# Patient Record
Sex: Female | Born: 1960 | Race: White | Hispanic: No | Marital: Married | Smoking: Never smoker
Health system: Southern US, Community
[De-identification: ages and names within clinical notes are randomized; demographics above are authoritative.]

## PROBLEM LIST (undated history)

## (undated) DIAGNOSIS — T7840XA Allergy, unspecified, initial encounter: Secondary | ICD-10-CM

## (undated) DIAGNOSIS — H8109 Meniere's disease, unspecified ear: Secondary | ICD-10-CM

## (undated) DIAGNOSIS — M8580 Other specified disorders of bone density and structure, unspecified site: Secondary | ICD-10-CM

## (undated) DIAGNOSIS — F419 Anxiety disorder, unspecified: Secondary | ICD-10-CM

## (undated) HISTORY — DX: Other specified disorders of bone density and structure, unspecified site: M85.80

## (undated) HISTORY — DX: Allergy, unspecified, initial encounter: T78.40XA

## (undated) HISTORY — PX: KNEE SURGERY: SHX244

## (undated) HISTORY — DX: Anxiety disorder, unspecified: F41.9

## (undated) HISTORY — PX: TONSILLECTOMY: SUR1361

## (undated) HISTORY — DX: Meniere's disease, unspecified ear: H81.09

## (undated) HISTORY — PX: KNEE ARTHROSCOPY: SUR90

---

## 2001-01-30 ENCOUNTER — Other Ambulatory Visit: Admission: RE | Admit: 2001-01-30 | Discharge: 2001-01-30 | Payer: Self-pay | Admitting: Obstetrics and Gynecology

## 2003-02-11 ENCOUNTER — Other Ambulatory Visit: Admission: RE | Admit: 2003-02-11 | Discharge: 2003-02-11 | Payer: Self-pay | Admitting: Obstetrics and Gynecology

## 2003-03-02 ENCOUNTER — Ambulatory Visit (HOSPITAL_COMMUNITY): Admission: RE | Admit: 2003-03-02 | Discharge: 2003-03-02 | Payer: Self-pay | Admitting: Obstetrics and Gynecology

## 2003-07-26 ENCOUNTER — Emergency Department (HOSPITAL_COMMUNITY): Admission: EM | Admit: 2003-07-26 | Discharge: 2003-07-26 | Payer: Self-pay | Admitting: Emergency Medicine

## 2006-02-22 ENCOUNTER — Other Ambulatory Visit: Admission: RE | Admit: 2006-02-22 | Discharge: 2006-02-22 | Payer: Self-pay | Admitting: Obstetrics & Gynecology

## 2009-10-13 ENCOUNTER — Ambulatory Visit (HOSPITAL_COMMUNITY): Admission: RE | Admit: 2009-10-13 | Discharge: 2009-10-13 | Payer: Self-pay | Admitting: Obstetrics & Gynecology

## 2009-10-20 ENCOUNTER — Ambulatory Visit (HOSPITAL_COMMUNITY): Admission: RE | Admit: 2009-10-20 | Discharge: 2009-10-20 | Payer: Self-pay | Admitting: Obstetrics & Gynecology

## 2011-04-06 ENCOUNTER — Emergency Department
Admission: EM | Admit: 2011-04-06 | Discharge: 2011-04-06 | Disposition: A | Discharge status: Home / Self Care | Payer: 59 | Source: Home / Self Care | Attending: Emergency Medicine | Admitting: Emergency Medicine

## 2011-04-06 ENCOUNTER — Encounter: Payer: Self-pay | Admitting: *Deleted

## 2011-04-06 DIAGNOSIS — R300 Dysuria: Secondary | ICD-10-CM

## 2011-04-06 DIAGNOSIS — N390 Urinary tract infection, site not specified: Secondary | ICD-10-CM

## 2011-04-06 LAB — POCT URINALYSIS DIP (MANUAL ENTRY)
Spec Grav, UA: 1.01 (ref 1.005–1.03)
Urobilinogen, UA: 0.2 (ref 0–1)
pH, UA: 7 (ref 5–8)

## 2011-04-06 MED ORDER — CIPROFLOXACIN HCL 500 MG PO TABS: 500.0000 mg | ORAL_TABLET | Freq: Two times a day (BID) | ORAL | Status: AC

## 2011-04-06 NOTE — ED Notes (Signed)
Patient reports dysuria, frequency and pressure x last night. Taken AZO OTC.

## 2011-04-06 NOTE — ED Provider Notes (Signed)
History     CSN: [id number]  Arrival date & time 04/06/11  5621   First MD Initiated Contact with Patient 04/06/11 1819      No chief complaint on file.   (Consider location/radiation/quality/duration/timing/severity/associated sxs/prior treatment) HPI Jody Clarke is a 50 y.o. female who presents today with UTI symptoms for 2 days.  + dysuria + frequency + urgency No hematuria No vaginal discharge No fever/chills No lower abdominal pain No back pain No fatigue    No past medical history on file.  No past surgical history on file.  No family history on file.  History  Substance Use Topics  . Smoking status: Not on file  . Smokeless tobacco: Not on file  . Alcohol Use: Not on file    OB History    No data available      Review of Systems  Allergies  Review of patient's allergies indicates not on file.  Home Medications  No current outpatient prescriptions on file.  There were no vitals taken for this visit.  Physical Exam  Nursing note and vitals reviewed. Constitutional: She is oriented to person, place, and time. She appears well-developed and well-nourished.  HENT:  Head: Normocephalic and atraumatic.  Eyes: No scleral icterus.  Neck: Neck supple.  Cardiovascular: Regular rhythm and normal heart sounds.   Pulmonary/Chest: Effort normal and breath sounds normal. No respiratory distress.  Abdominal: Soft. Normal appearance and bowel sounds are normal. She exhibits no mass. There is no rigidity, no rebound, no guarding and no CVA tenderness.  Neurological: She is alert and oriented to person, place, and time.  Skin: Skin is warm and dry.  Psychiatric: She has a normal mood and affect. Her speech is normal.    ED Course  Procedures (including critical care time)   Labs Reviewed  URINE CULTURE  POCT URINALYSIS DIP (MANUAL ENTRY)   No results found.   1. Dysuria   2. Urinary tract infection, site not specified       MDM  1) Take the  prescribed antibiotic as directed. 2) A urinalysis was done in clinic.  A urine culture is pending. 3) Follow up with your PCP or urologist if not improving or if worsening symptoms.  Patient states that she remembers perhaps taking Cipro in the past and thinks it may have made her a little bit agitated, but she is very unsure if past with it was. I told her to she has any side effects, that she should call us and we will change his antibiotic to either Bactrim or Macrobid, depending on the sensitivities.    Lily Kocher, MD 04/06/11 [name]

## 2011-04-07 ENCOUNTER — Telehealth: Payer: Self-pay | Admitting: Emergency Medicine

## 2011-04-09 LAB — URINE CULTURE: Colony Count: 40000

## 2011-11-10 ENCOUNTER — Emergency Department
Admission: EM | Admit: 2011-11-10 | Discharge: 2011-11-10 | Disposition: A | Discharge status: Home / Self Care | Payer: 59 | Source: Home / Self Care

## 2011-11-10 ENCOUNTER — Encounter: Payer: Self-pay | Admitting: *Deleted

## 2011-11-10 DIAGNOSIS — J329 Chronic sinusitis, unspecified: Secondary | ICD-10-CM

## 2011-11-10 MED ORDER — AMOXICILLIN 875 MG PO TABS: 875.0000 mg | ORAL_TABLET | Freq: Two times a day (BID) | ORAL | Status: AC

## 2011-11-10 MED ORDER — METHYLPREDNISOLONE ACETATE 80 MG/ML IJ SUSP: 80.0000 mg | Freq: Once | INTRAMUSCULAR | Status: DC

## 2011-11-10 MED ORDER — FLUTICASONE PROPIONATE 50 MCG/ACT NA SUSP: 2.0000 | Freq: Every day | NASAL | Status: DC

## 2011-11-10 MED ORDER — CETIRIZINE HCL 10 MG PO TABS: 10.0000 mg | ORAL_TABLET | Freq: Every day | ORAL | Status: DC

## 2011-11-10 NOTE — ED Provider Notes (Signed)
History     CSN: [id number]  Arrival date & time 11/10/11  1516   First MD Initiated Contact with Patient 11/10/11 1522      Chief Complaint  Patient presents with  . Sinus Problem   HPI SINUSITIS Onset:  4-5 days  Location: frontal sinusus Description:sinus pressure and congestion, mild headache  Modifying factors: Pt recently treated for sinusitis by PCP with amox x 10 days. Sxs initially improved, then returned after 2-3 days off of antibiotics.  Baseline hx/o meniere's disease. Has had mild dizziness since recurrence of sinus pressure. No fevers, chills.    Symptoms Cough:  no Discharge:  no Fever: no Sinus Pressure:  yes Ears Blocked:  mild Teeth Ache:  no Frontal Headache:  yes Second Sickening:  No Itchy eyes/scratchty throat: yes   Red Flags Change in mental state: no Change in vision: mild intermittent blurred vision.     History reviewed. No pertinent past medical history.  Past Surgical History  Procedure Date  . Knee arthroscopy     right    History reviewed. No pertinent family history.  History  Substance Use Topics  . Smoking status: Never Smoker   . Smokeless tobacco: Not on file  . Alcohol Use: Yes    OB History    Grav Para Term Preterm Abortions TAB SAB Ect Mult Living                  Review of Systems  All other systems reviewed and are negative.    Allergies  Ciprofloxacin  Home Medications   Current Outpatient Rx  Name Route Sig Dispense Refill  . AMOXICILLIN 875 MG PO TABS Oral Take 1 tablet (875 mg total) by mouth 2 (two) times daily. 14 tablet 0  . CETIRIZINE HCL 10 MG PO TABS Oral Take 1 tablet (10 mg total) by mouth daily. 30 tablet 6  . FLUTICASONE PROPIONATE 50 MCG/ACT NA SUSP Nasal Place 2 sprays into the nose daily. 16 g 12    BP 104/72  Pulse 71  Temp 97.6 F (36.4 C) (Oral)  Resp 14  Ht 5\' 3"  (1.6 m)  Wt 113 lb 4 oz (51.37 kg)  BMI 20.06 kg/m2  SpO2 100%  Physical Exam  Constitutional: She  appears well-developed and well-nourished.  HENT:  Head: Normocephalic and atraumatic.  Right Ear: External ear normal.  Left Ear: External ear normal.       +mild nasal erythema, rhinorrhea bilaterally, + post oropharyngeal erythema  + mild frontal sinus TTP   Eyes: Conjunctivae are normal. Pupils are equal, round, and reactive to light.  Neck: Normal range of motion. Neck supple.  Cardiovascular: Normal rate and regular rhythm.   Pulmonary/Chest: Effort normal and breath sounds normal.  Abdominal: Soft. Bowel sounds are normal.  Musculoskeletal: Normal range of motion.  Lymphadenopathy:    She has no cervical adenopathy.  Neurological: She is alert.  Skin: Skin is warm.    ED Course  Procedures (including critical care time)  Labs Reviewed - No data to display No results found.   1. Sinusitis       MDM  Depo medrol 80mg  IM x1. This should help with sinus inflammation and mild menieres flare.  Will restart amox.  Plan for ENT follow up as pt may benefit from sinus CT.  Flonase and zyrtec for allergic rhinitis component.  Handout given.  Infectious red flags reviewed.  Follow up as needed.      The patient and/or caregiver  has been counseled thoroughly with regard to treatment plan and/or medications prescribed including dosage, schedule, interactions, rationale for use, and possible side effects and they verbalize understanding. Diagnoses and expected course of recovery discussed and will return if not improved as expected or if the condition worsens. Patient and/or caregiver verbalized understanding.             Floydene Flock, MD 11/10/11 [phone]

## 2011-11-10 NOTE — ED Notes (Signed)
Pt treated a wk ago for sinusitis with amoxicillin.  Sx came back 4 days ago sinus pain and pressure, fatigue and dizziness

## 2011-11-15 NOTE — ED Provider Notes (Signed)
Agree with exam, assessment, and plan.   Lattie Haw, MD 11/15/11 [phone]

## 2012-04-16 HISTORY — PX: OTHER SURGICAL HISTORY: SHX169

## 2012-09-01 ENCOUNTER — Telehealth: Payer: Self-pay | Admitting: Obstetrics & Gynecology

## 2012-09-01 NOTE — Telephone Encounter (Signed)
See note below. Please advise. sue 

## 2012-09-01 NOTE — Telephone Encounter (Signed)
Patient saw her PCP two weeks ago for what she thought was a UTI. She was prescribed an antibiotic; the culture came back normal/no growth. She returned to her PCP this last week because she symptoms have not gone away. She was put on a second antibiotic and another culture was taken. The second culture came back normal/ no growth. A pelvic exam was completed by her PCP and everything was normal. Her PCP recommended that she follow up with a Urologist since the symptoms still have not subsided. The main symptoms are pressure in her bladder, and frequency of urination. The Patient would like to know if she should follow up with [place] before the Urologist?

## 2012-09-02 NOTE — Telephone Encounter (Signed)
Not sure if I will find anything but I'm glad to see her before she sees a urologist.  We can always refer her to urology as well.  Please make OV.  See if she can come at 10:30 today or 3:00pm.  Both work with my schedule.

## 2012-09-02 NOTE — Telephone Encounter (Signed)
Patient notified of Dr. Hyacinth Meeker response. Patient is currently out of town this week . Patient states based on Dr. Hyacinth Meeker response and her PCP stating normal pelvic exam and normal /no growth culture of urine will plan on doing referral to urologist form PCP office. Jody Clarke

## 2012-09-13 ENCOUNTER — Emergency Department: Payer: Self-pay | Admitting: Emergency Medicine

## 2012-09-13 LAB — COMPREHENSIVE METABOLIC PANEL
Albumin: 4 g/dL (ref 3.4–5.0)
Alkaline Phosphatase: 61 U/L (ref 50–136)
Anion Gap: 8 (ref 7–16)
Bilirubin,Total: 0.3 mg/dL (ref 0.2–1.0)
Calcium, Total: 9 mg/dL (ref 8.5–10.1)
Co2: 26 mmol/L (ref 21–32)
EGFR (African American): >60
EGFR (Non-African Amer.): >60
Glucose: 172 mg/dL — ABNORMAL HIGH (ref 65–99)
Osmolality: 282 (ref 275–301)
Potassium: 3.3 mmol/L — ABNORMAL LOW (ref 3.5–5.1)
SGOT(AST): 24 U/L (ref 15–37)
Sodium: 138 mmol/L (ref 136–145)
Total Protein: 7.3 g/dL (ref 6.4–8.2)

## 2012-09-13 LAB — CK TOTAL AND CKMB (NOT AT ARMC)
CK, Total: 53 U/L (ref 21–215)
CK-MB: 0.6 ng/mL (ref 0.5–3.6)

## 2012-09-13 LAB — CBC
Platelet: 252 10*3/uL (ref 150–440)
RBC: 4.37 10*6/uL (ref 3.80–5.20)
RDW: 12.6 % (ref 11.5–14.5)
WBC: 8.4 10*3/uL (ref 3.6–11.0)

## 2012-09-13 LAB — TROPONIN I: Troponin-I: <0.02 ng/mL

## 2012-11-14 HISTORY — PX: ENDOLYMPHATIC SHUNT DECOMPRESSION: SHX1498

## 2012-12-03 DIAGNOSIS — H81319 Aural vertigo, unspecified ear: Secondary | ICD-10-CM | POA: Insufficient documentation

## 2013-02-02 ENCOUNTER — Telehealth: Payer: Self-pay | Admitting: Obstetrics & Gynecology

## 2013-02-02 NOTE — Telephone Encounter (Signed)
[facility].  [facility] internal medicine (dr. Lynne Logan office)  Either.

## 2013-02-02 NOTE — Telephone Encounter (Signed)
Patient is wanting a recommendation from [facility] for a pcp. More of the internist route not a family doctor

## 2013-02-02 NOTE — Telephone Encounter (Signed)
Routing to Dr. Miller

## 2013-02-03 NOTE — Telephone Encounter (Signed)
LM on pt's VM confirming name re: internists Dr. Hyacinth Meeker recommends. [facility] [phone], or Dr. Juline Patch [phone]. Pt to call back with any questions.

## 2013-05-19 ENCOUNTER — Encounter: Payer: Self-pay | Admitting: Obstetrics & Gynecology

## 2013-05-21 ENCOUNTER — Ambulatory Visit: Payer: Self-pay | Admitting: Obstetrics & Gynecology

## 2013-06-08 ENCOUNTER — Encounter: Payer: Self-pay | Admitting: Obstetrics & Gynecology

## 2013-06-08 ENCOUNTER — Ambulatory Visit (INDEPENDENT_AMBULATORY_CARE_PROVIDER_SITE_OTHER): Payer: 59 | Admitting: Obstetrics & Gynecology

## 2013-06-08 VITALS — BP 110/68 | HR 70 | Resp 16 | Ht 62.75 in | Wt 103.8 lb

## 2013-06-08 DIAGNOSIS — Z01419 Encounter for gynecological examination (general) (routine) without abnormal findings: Secondary | ICD-10-CM

## 2013-06-08 DIAGNOSIS — Z0000 Encounter for general adult medical examination without abnormal findings: Secondary | ICD-10-CM

## 2013-06-08 DIAGNOSIS — Z1239 Encounter for other screening for malignant neoplasm of breast: Secondary | ICD-10-CM

## 2013-06-08 LAB — POCT URINALYSIS DIPSTICK
Bilirubin, UA: NEGATIVE
Blood, UA: NEGATIVE
Glucose, UA: NEGATIVE
Ketones, UA: NEGATIVE
Leukocytes, UA: NEGATIVE
Nitrite, UA: NEGATIVE
Protein, UA: NEGATIVE
Urobilinogen, UA: NEGATIVE
pH, UA: 5

## 2013-06-08 MED ORDER — ALPRAZOLAM 0.5 MG PO TABS: 0.5000 mg | ORAL_TABLET | Freq: Every evening | ORAL | Status: DC | PRN

## 2013-06-08 NOTE — Patient Instructions (Signed)

## 2013-06-08 NOTE — Progress Notes (Signed)
53 y.o. G2P1 MarriedCaucasianF here for annual exam.  Has had really severe issues with Meniere's disease.  Has endolymphatic shunt.  [facility] like he has done all he can.  Dr. Wylene Simmerisovec has put her on Zoloft.    Isn't exercising at all right now because she feels the dizziness.  Shopped yesterday with her daughter and after three or four stores, she got the same dizziness.   Has lost 9 pounds but she feels this is due to not feeling like eating when she is dizzy.    Saw a urologist last year due to sensation of having a UTI.  Cystoscopy was negative.  These symptoms have completely resolved.    Daughter getting married in May.  Is a patient here.    Patient's last menstrual period was 04/16/2008.          Sexually active: yes  The current method of family planning is none and post menopausal status.    Exercising: no  not recently due to health issues Smoker:  no  Health Maintenance: Pap:  02/19/12 WNL/negative HR HPV History of abnormal Pap:  no MMG:  10/13/09 normal Colonoscopy:  none BMD:   10/20/09-osteopenia TDaP:  Up to date Screening Labs: Dr. Wylene Simmerisovec, Hb today: PCP, Urine today: negative   reports that she has never smoked. She has never used smokeless tobacco. She reports that she drinks alcohol. She reports that she does not use illicit drugs.  Past Medical History  Diagnosis Date  . Anxiety   . Meniere's disease   . Osteopenia     Past Surgical History  Procedure Laterality Date  . Knee arthroscopy      right  . [facility]    Current Outpatient Prescriptions  Medication Sig Dispense Refill  . ALPRAZOLAM PO Take by mouth as needed.      . Calcium Carb-Cholecalciferol (CALCIUM + D3) 600-200 MG-UNIT TABS Take 1 tablet by mouth daily.      Marland Kitchen. lactobacillus acidophilus (BACID) TABS tablet Take 2 tablets by mouth 3 (three) times daily.      . Multiple Vitamins-Minerals (MULTIVITAMIN PO) Take by mouth daily.      . RESTASIS 0.05 %  ophthalmic emulsion Place 1 drop into both eyes daily.      . sertraline (ZOLOFT) 50 MG tablet Take 50 mg by mouth daily.      Marland Kitchen. triamterene-hydrochlorothiazide (DYAZIDE) 37.5-25 MG per capsule Take 1 capsule by mouth daily.      . cetirizine (ZYRTEC) 10 MG tablet Take 1 tablet (10 mg total) by mouth daily.  30 tablet  6  . fluticasone (FLONASE) 50 MCG/ACT nasal spray Place 2 sprays into the nose daily.  16 g  12   No current facility-administered medications for this visit.    Family History  Problem Relation Age of Onset  . Atrial fibrillation Father   . Osteoporosis Mother     ROS:  Pertinent items are noted in HPI.  Otherwise, a comprehensive ROS was negative.  Exam:   BP 110/68  Pulse 70  Resp 16  Ht 5' 2.75" (1.594 m)  Wt 103 lb 12.8 oz (47.083 kg)  BMI 18.53 kg/m2  LMP 04/16/2008  Weight change: -9lbs  Height: 5' 2.75" (159.4 cm)  Ht Readings from Last 3 Encounters:  06/08/13 5' 2.75" (1.594 m)  11/10/11 5\' 3"  (1.6 m)  04/06/11 5' 2.5" (1.588 m)    General appearance: alert, cooperative  and appears stated age Head: Normocephalic, without obvious abnormality, atraumatic Neck: no adenopathy, supple, symmetrical, trachea midline and thyroid normal to inspection and palpation Lungs: clear to auscultation bilaterally Breasts: normal appearance, no masses or tenderness Heart: regular rate and rhythm Abdomen: soft, non-tender; bowel sounds normal; no masses,  no organomegaly Extremities: extremities normal, atraumatic, no cyanosis or edema Skin: Skin color, texture, turgor normal. No rashes or lesions Lymph nodes: Cervical, supraclavicular, and axillary nodes normal. No abnormal inguinal nodes palpated Neurologic: Grossly normal   Pelvic: External genitalia:  no lesions              Urethra:  normal appearing urethra with no masses, tenderness or lesions              Bartholins and Skenes: normal                 Vagina: normal appearing vagina with normal color and  discharge, no lesions              Cervix: no lesions              Pap taken: no Bimanual Exam:  Uterus:  normal size, contour, position, consistency, mobility, non-tender              Adnexa: normal adnexa and no mass, fullness, tenderness               Rectovaginal: Confirms               Anus:  normal sphincter tone, no lesions  A:  Well Woman with normal exam PMP, no HRT Meniere's disease Anxiety Osteopenia  P:   Mammogram HIGHLY recommended.  Pt knows I think she needs one.  Order placed. Colonoscopy recommended. Pt declined. D/W pt trial of HRT.  Risks/benefits discussed.   pap smear with neg HR HPV 11/13.  No Pap today. return annually or prn  An After Visit Summary was printed and given to the patient.

## 2013-06-09 LAB — HEMOGLOBIN, FINGERSTICK: Hemoglobin, fingerstick: 14.7 g/dL (ref 12.0–16.0)

## 2013-06-22 ENCOUNTER — Telehealth: Payer: Self-pay | Admitting: Obstetrics & Gynecology

## 2013-06-22 NOTE — Telephone Encounter (Signed)
Spoke with patient. She has been on Zoloft x 3 weeks. She is feeling "slightly improved" but still has "moments of agitation".  States "I feel up and down."  On Friday it will be 4 weeks that patient has been on treatment and wondering what Dr. Hyacinth MeekerMiller would suggest? Does she need to give it more time with Zoloft or can she start with HRT therapy?    I advised I would send a message to Dr. Hyacinth MeekerMiller or covering provider to review and advise. I also advised patient that Dr. Hyacinth MeekerMiller wanted her to schedule Mammogram as well. Patient states she will call today to schedule Mammogram.

## 2013-06-22 NOTE — Telephone Encounter (Signed)
She and I discussed starting this if the Zoloft didn't work.  She will need to be on both estrogen and progesterone.  She also KNOWS she needs a MMG and I cannot continue the HRT if she doesn't get a MMG.  OK to call in Estradiol 1.0mg  daily and Prometrium 200mg  days 1-15 each month.  Needs f/u 4 weeks.  No refills if she hasn't gotten a MMG by then.  Please be kind when telling her this as she has been struggling with vertigo but she and I did discuss this at her exam and she knows how important I think this is.  Thank you.

## 2013-06-22 NOTE — Telephone Encounter (Signed)
Patient calling to with some questions for nurse about HRT as she thinks she may be ready to start this therapy.  [facility]  [address] [place]

## 2013-06-23 ENCOUNTER — Ambulatory Visit (HOSPITAL_COMMUNITY)
Admission: RE | Admit: 2013-06-23 | Discharge: 2013-06-23 | Disposition: A | Discharge status: Home / Self Care | Payer: 59 | Source: Ambulatory Visit | Attending: Obstetrics & Gynecology | Admitting: Obstetrics & Gynecology

## 2013-06-23 DIAGNOSIS — Z1239 Encounter for other screening for malignant neoplasm of breast: Secondary | ICD-10-CM

## 2013-06-23 DIAGNOSIS — Z1231 Encounter for screening mammogram for malignant neoplasm of breast: Secondary | ICD-10-CM | POA: Insufficient documentation

## 2013-06-23 MED ORDER — PROGESTERONE MICRONIZED 200 MG PO CAPS: ORAL_CAPSULE | ORAL | Status: DC

## 2013-06-23 MED ORDER — ESTRADIOL 1 MG PO TABS: 1.0000 mg | ORAL_TABLET | Freq: Every day | ORAL | Status: DC

## 2013-06-23 NOTE — Telephone Encounter (Signed)
Can close encounter if pt agreeable.

## 2013-06-23 NOTE — Telephone Encounter (Signed)
Spoke with patient and message from Dr. Hyacinth MeekerMiller given.  Patient states that Dr. Hyacinth MeekerMiller suggested that she start on a low dose HRT and states she was told it would be 0.5 mg of "some medication".  I advised the order I placed was what was ordered by Dr. Hyacinth MeekerMiller, but that I would clarify. Dr. Hyacinth MeekerMiller do you want to continue with Estradiol at 1.0 mg daily?  Patient scheduled for 4 week follow up.  [facility]

## 2013-06-23 NOTE — Telephone Encounter (Signed)
She can start with 1/2 tablet.  She is correct that we discussed the 0.5mg  dose.

## 2013-06-24 NOTE — Telephone Encounter (Signed)
Spoke with patient. Agreeable to half a tablet to start with. Has follow up scheduled. Will wait until 07/15/13 to start progesterone.   Routing to provider for final review. Patient agreeable to disposition. Will close encounter

## 2013-07-21 ENCOUNTER — Ambulatory Visit (INDEPENDENT_AMBULATORY_CARE_PROVIDER_SITE_OTHER): Payer: 59 | Admitting: Obstetrics & Gynecology

## 2013-07-21 VITALS — BP 100/64 | HR 64 | Resp 16 | Ht 62.75 in | Wt 104.0 lb

## 2013-07-21 DIAGNOSIS — Z79890 Hormone replacement therapy (postmenopausal): Secondary | ICD-10-CM

## 2013-07-21 MED ORDER — ESTRADIOL 1 MG PO TABS: 1.0000 mg | ORAL_TABLET | Freq: Every day | ORAL | Status: DC

## 2013-07-21 MED ORDER — PROGESTERONE MICRONIZED 200 MG PO CAPS: ORAL_CAPSULE | ORAL | Status: DC

## 2013-07-21 NOTE — Progress Notes (Signed)
Subjective:     Patient ID: Jody Clarke, female   DOB: 02/06/1961, 53 y.o.   MRN: [id number]  HPI 53 yo G2P1 MWF here for follow up after starting HRT.  Dizziness is better.  Has been able to exercise some which really makes her happy.  Saw Dr. Wylene Simmerisovec and he increased her Zoloft to 100mg .  Feels like having some neck issues which she feels is related to surgery.  Ready to start physical therapy.  She is ready to start that once she gets through her daughter's wedding.  Doesn't want anything to rock the boat right now as she is finally starting to feel better.  Unsure, however, if improvement is zoloft or HRT.  Taking Estradiol 0.5mg  daily.  Started Prometrium 200mg  days 1-15 monthly (April is first month that she is on the prometrium).  Hasn't finished the Prometrium yet but doesn't feel any worse or better while taking it.  Advised to expect bleeding and to let me know if occurs.  Review of Systems  All other systems reviewed and are negative.      Objective:   Physical Exam  Constitutional: She is oriented to person, place, and time. She appears well-developed and well-nourished.  Neurological: She is alert and oriented to person, place, and time.  Skin: Skin is warm and dry.  Psychiatric: She has a normal mood and affect.       Assessment:     Dizziness that has improved New start to HRT    Plan:     Continue current HRT dosage.  Pt is not going to change this until after her daughter's wedding.  Is probably going to try to wean off Zoloft or the HRT to see what actually improved her symptoms.    For now, rx done for a year.  Pt will let me know when ready to make a change so I can guide her in weaning off HRT.  ~15 minutes spent with patient >50% of time was in face to face discussion of above.

## 2013-07-22 ENCOUNTER — Encounter: Payer: Self-pay | Admitting: Obstetrics & Gynecology

## 2014-02-15 ENCOUNTER — Encounter: Payer: Self-pay | Admitting: Obstetrics & Gynecology

## 2014-04-30 ENCOUNTER — Telehealth: Payer: Self-pay | Admitting: Obstetrics & Gynecology

## 2014-04-30 ENCOUNTER — Ambulatory Visit (INDEPENDENT_AMBULATORY_CARE_PROVIDER_SITE_OTHER): Payer: 59 | Admitting: Certified Nurse Midwife

## 2014-04-30 ENCOUNTER — Encounter: Payer: Self-pay | Admitting: Certified Nurse Midwife

## 2014-04-30 ENCOUNTER — Telehealth: Payer: Self-pay | Admitting: Certified Nurse Midwife

## 2014-04-30 VITALS — BP 98/56 | HR 80 | Temp 98.8°F | Resp 16 | Ht 62.75 in | Wt 109.0 lb

## 2014-04-30 DIAGNOSIS — N342 Other urethritis: Secondary | ICD-10-CM

## 2014-04-30 DIAGNOSIS — R300 Dysuria: Secondary | ICD-10-CM

## 2014-04-30 DIAGNOSIS — N762 Acute vulvitis: Secondary | ICD-10-CM

## 2014-04-30 DIAGNOSIS — N760 Acute vaginitis: Secondary | ICD-10-CM

## 2014-04-30 LAB — POCT URINALYSIS DIPSTICK
Bilirubin, UA: NEGATIVE
Blood, UA: NEGATIVE
Glucose, UA: NEGATIVE
Ketones, UA: NEGATIVE
Nitrite, UA: NEGATIVE
Protein, UA: NEGATIVE
Urobilinogen, UA: NEGATIVE
pH, UA: 5

## 2014-04-30 MED ORDER — NITROFURANTOIN MONOHYD MACRO 100 MG PO CAPS: 100.0000 mg | ORAL_CAPSULE | Freq: Two times a day (BID) | ORAL | Status: DC

## 2014-04-30 NOTE — Telephone Encounter (Signed)
Pt is having some burning, irritation and pain.

## 2014-04-30 NOTE — Progress Notes (Signed)
54 y.o. married white g2p2010 here with complaint of UTI, with onset  In past 24 hours. Patient complaining of urinary frequency/urgency/ and slight pain with urination. Pain was very intense last pm. Patient denies fever, chills, nausea or back pain.New personal products with soap use. Patient feels not related to sexual activity. Denies any vaginal symptoms. Contraception is menopausal. Menopausal with vaginal dryness occasional.   O: Healthy female WDWN Affect: Normal, orientation x 3 Skin : warm and dry CVAT: Negative bilateral Abdomen: positive for suprapubic tenderness  Pelvic exam: External genital area: normal female with atrophic appearance Bladder,Urethra, Urethral meatus: tender Vagina: normal vaginal discharge, normal appearance  Wet prep taken  Ph 4.0, small very thin area outside fourchette, very thin, tender Cervix: normal, non tender Uterus:normal,non tender Adnexa: normal non tender, no fullness or masses Wet Prep negative  A: Urethritis Atrophic vulvitis  P: Reviewed findings of urethrtis and need to increase water intake and pat when urinating, instead of wiping. ZO:[id number]:Macrobid see order UJW:JXBJYLab:Urine  culture Reviewed warning signs and symptoms of UTI Encouraged to limit soda, tea, and coffee Reviewed finding of atrophic vulvitis and need for moisture to area to improve skin integrity. Encouraged to try coconut oil to area daily. Advise if symptoms are not resolving.  RV prn

## 2014-04-30 NOTE — Telephone Encounter (Signed)
Spoke with patient. Patient has been taking AZO and would like to make sure this will not effect her results today. Advised can effect dipstick but will not effect culture which is what allows us to see she is being properly treated. Patient is agreeable.  Routing to provider for final review. Patient agreeable to disposition. Will close encounter

## 2014-04-30 NOTE — Patient Instructions (Signed)
Atrophic Vaginitis Atrophic vaginitis is a problem of low levels of estrogen in women. This problem can happen at any age. It is most common in women who have gone through menopause ("the change").  HOW WILL I KNOW IF I HAVE THIS PROBLEM? You may have:  Trouble with peeing (urinating), such as:  Going to the bathroom often.  A hard time holding your pee until you reach a bathroom.  Leaking pee.  Having pain when you pee.  Itching or a burning feeling.  Vaginal bleeding and spotting.  Pain during sex.  Dryness of the vagina.  A yellow, bad-smelling fluid (discharge) coming from the vagina. HOW WILL MY DOCTOR CHECK FOR THIS PROBLEM?  During your exam, your doctor will likely find the problem.  If there is a vaginal fluid, it may be checked for infection. HOW WILL THIS PROBLEM BE TREATED? Keep the vulvar skin as clean as possible. Moisturizers and lubricants can help with some of the symptoms. Estrogen replacement can help. There are 2 ways to take estrogen:  Systemic estrogen gets estrogen to your whole body. It takes many weeks or months before the symptoms get better.  You take an estrogen pill.  You use a skin patch. This is a patch that you put on your skin.  If you still have your uterus, your doctor may ask you to take a hormone. Talk to your doctor about the right medicine for you.  Estrogen cream.  This puts estrogen only at the part of your body where you apply it. The cream is put into the vagina or put on the vulvar skin. For some women, estrogen cream works faster than pills or the patch. CAN ALL WOMEN WITH THIS PROBLEM USE ESTROGEN? No. Women with certain types of cancer, liver problems, or problems with blood clots should not take estrogen. Your doctor can help you decide the best treatment for your symptoms. Document Released: 09/19/2007 Document Revised: 04/07/2013 Document Reviewed: 09/19/2007 [facility] Patient Information 2015 [place] [place]. This  information is not intended to replace advice given to you by your health care provider. Make sure you discuss any questions you have with your health care provider. Urethritis Urethritis is an inflammation of the tube through which urine exits your bladder (urethra).  CAUSES Urethritis is often caused by an infection in your urethra. The infection can be viral, like herpes. The infection can also be bacterial, like gonorrhea. RISK FACTORS Risk factors of urethritis include:  Having sex without using a condom.  Having multiple sexual partners.  Having poor hygiene. SIGNS AND SYMPTOMS Symptoms of urethritis are less noticeable in women than in men. These symptoms include:  Burning feeling when you urinate (dysuria).  Discharge from your urethra.  Blood in your urine (hematuria).  Urinating more than usual. DIAGNOSIS  To confirm a diagnosis of urethritis, your health care provider will do the following:  Ask about your sexual history.  Perform a physical exam.  Have you provide a sample of your urine for lab testing.  Use a cotton swab to gently collect a sample from your urethra for lab testing. TREATMENT  It is important to treat urethritis. Depending on the cause, untreated urethritis may lead to serious genital infections and possibly infertility. Urethritis caused by a bacterial infection is treated with antibiotic medicine. All sexual partners must be treated.  HOME CARE INSTRUCTIONS  Do not have sex until the test results are known and treatment is completed, even if your symptoms go away before you finish  treatment.  If you were prescribed an antibiotic, finish it all even if you start to feel better. SEEK MEDICAL CARE IF:   Your symptoms are not improved in 3 days.  Your symptoms are getting worse.  You develop abdominal pain or pelvic pain (in women).  You develop joint pain.  You have a fever. SEEK IMMEDIATE MEDICAL CARE IF:   You have severe pain in the  belly, back, or side.  You have repeated vomiting. MAKE SURE YOU:  Understand these instructions.  Will watch your condition.  Will get help right away if you are not doing well or get worse. Document Released: 09/26/2000 Document Revised: 08/17/2013 Document Reviewed: 12/01/2012 [facility] Patient Information 2015 [place], [place]. This information is not intended to replace advice given to you by your health care provider. Make sure you discuss any questions you have with your health care provider.

## 2014-04-30 NOTE — Telephone Encounter (Signed)
Spoke with patient. Patient states two days ago she began having vaginal irritation. Yesterday symptoms increased and now has pain and burning with urination. Denies lower back pain, fevers, and chills. Advised patient will need to be seen for evaluation in office. Patient is agreeable. Appointment scheduled for today at 2:30pm with Verner Choleborah S. Leonard CNM. Agreeable to date and time.  Cc: Dr.Miller  Routing to provider for final review. Patient agreeable to disposition. Will close encounter

## 2014-04-30 NOTE — Telephone Encounter (Signed)
Patient has an appointment today at 2:30 (uti). Patient is asking if Azo would affect her appointment today.

## 2014-04-30 NOTE — Progress Notes (Signed)
Reviewed personally.  M. Suzanne Rochell Puett, MD.  

## 2014-05-03 LAB — URINE CULTURE: Colony Count: 80000

## 2014-05-04 ENCOUNTER — Telehealth: Payer: Self-pay

## 2014-05-04 NOTE — Telephone Encounter (Signed)
lmtcb

## 2014-05-04 NOTE — Telephone Encounter (Signed)
-----   Message from Verner Choleborah S Leonard, CNM sent at 05/04/2014  8:41 AM EST ----- Notify patient that culture showed Citrobacter Koseri and she is on appropriate medication. Complete medication. No further evaluation indicated unless symptoms do not resolve Patient status?

## 2014-05-04 NOTE — Telephone Encounter (Signed)
Patient notified of results. See lab 

## 2014-06-15 ENCOUNTER — Ambulatory Visit (INDEPENDENT_AMBULATORY_CARE_PROVIDER_SITE_OTHER): Payer: 59 | Admitting: Obstetrics & Gynecology

## 2014-06-15 ENCOUNTER — Encounter: Payer: Self-pay | Admitting: Obstetrics & Gynecology

## 2014-06-15 VITALS — BP 108/70 | HR 84 | Resp 16 | Ht 62.75 in | Wt 108.0 lb

## 2014-06-15 DIAGNOSIS — Z01419 Encounter for gynecological examination (general) (routine) without abnormal findings: Secondary | ICD-10-CM | POA: Diagnosis not present

## 2014-06-15 DIAGNOSIS — Z124 Encounter for screening for malignant neoplasm of cervix: Secondary | ICD-10-CM

## 2014-06-15 MED ORDER — ESTRADIOL 1 MG PO TABS: 1.0000 mg | ORAL_TABLET | Freq: Every day | ORAL | Status: DC

## 2014-06-15 MED ORDER — PROGESTERONE MICRONIZED 200 MG PO CAPS: ORAL_CAPSULE | ORAL | Status: DC

## 2014-06-15 NOTE — Progress Notes (Signed)
54 y.o. G2P1 MarriedCaucasianF here for annual exam.  Reports she is doing better from a Meniere's disease.  Reports anxiety is better as well.  Now, she reports she does have some vestibulitis issues daily but "big" issues with it are every three to four.  Pt feels that she really has some seasonal component to it as well.    Pt going through disability process.  Now with an attorney for this process.    Pt reports she is considering stopping the Zoloft.    Husband was laid off in August.  This has been stressful for them both.    Patient's last menstrual period was 04/16/2008.          Sexually active: Yes.    The current method of family planning is post menopausal status.    Exercising: Yes.    Treadmill, light weights Smoker:  no  Health Maintenance: Pap:  02/2012 Neg. HR HPV:neg History of abnormal Pap:  no MMG:  06/2013 BIRADS1:neg Colonoscopy:  Never.  Stool test for blood was negative with Dr. Wylene Simmer in July 2015. BMD:   10/2009 Osteopenia TDaP:  ? Screening Labs:PCP , Hb today: PCP, Urine today: PCP   reports that she has never smoked. She has never used smokeless tobacco. She reports that she does not drink alcohol or use illicit drugs.  Past Medical History  Diagnosis Date  . Anxiety   . Meniere's disease   . Osteopenia     Past Surgical History  Procedure Laterality Date  . Knee arthroscopy      right  . Endolymphatic shunt decompression  8/14    in [facility]    Current Outpatient Prescriptions  Medication Sig Dispense Refill  . Calcium Carbonate-Vitamin D 600-200 MG-UNIT TABS Take by mouth.    . diazepam (VALIUM) 5 MG tablet Take 5 mg by mouth as needed for anxiety.    Marland Kitchen estradiol (ESTRACE) 1 MG tablet Take 1 tablet (1 mg total) by mouth daily. 30 tablet 12  . lactobacillus acidophilus (BACID) TABS tablet Take 2 tablets by mouth 3 (three) times daily.    . meclizine (ANTIVERT) 25 MG tablet Take 25 mg by mouth.    . Multiple Vitamins-Minerals (MULTIVITAMIN  PO) Take by mouth daily.    . progesterone (PROMETRIUM) 200 MG capsule Take one tablet daily on days 1-15 of the month. 15 capsule 12  . RESTASIS 0.05 % ophthalmic emulsion Place 1 drop into both eyes daily.    . sertraline (ZOLOFT) 100 MG tablet Take 100 mg by mouth daily.    . Triamcinolone Acetonide (NASACORT AQ NA) Place into the nose.    . triamterene-hydrochlorothiazide (DYAZIDE) 37.5-25 MG per capsule Take 1 capsule by mouth daily.     No current facility-administered medications for this visit.    Family History  Problem Relation Age of Onset  . Atrial fibrillation Father   . Osteoporosis Mother     ROS:  Pertinent items are noted in HPI.  Otherwise, a comprehensive ROS was negative.  Exam:   BP 108/70 mmHg  Pulse 84  Resp 16  Ht 5' 2.75" (1.594 m)  Wt 108 lb (48.988 kg)  BMI 19.28 kg/m2  LMP 04/16/2008  Weight change: +5#   Height: 5' 2.75" (159.4 cm)  Ht Readings from Last 3 Encounters:  06/15/14 5' 2.75" (1.594 m)  04/30/14 5' 2.75" (1.594 m)  07/21/13 5' 2.75" (1.594 m)    General appearance: alert, cooperative and appears stated age Head: Normocephalic, without  obvious abnormality, atraumatic Neck: no adenopathy, supple, symmetrical, trachea midline and thyroid normal to inspection and palpation Lungs: clear to auscultation bilaterally Breasts: normal appearance, no masses or tenderness Heart: regular rate and rhythm Abdomen: soft, non-tender; bowel sounds normal; no masses,  no organomegaly Extremities: extremities normal, atraumatic, no cyanosis or edema Skin: Skin color, texture, turgor normal. No rashes or lesions Lymph nodes: Cervical, supraclavicular, and axillary nodes normal. No abnormal inguinal nodes palpated Neurologic: Grossly normal   Pelvic: External genitalia:  no lesions              Urethra:  normal appearing urethra with no masses, tenderness or lesions              Bartholins and Skenes: normal                 Vagina: normal appearing  vagina with normal color and discharge, no lesions              Cervix: no lesions              Pap taken: Yes.   Bimanual Exam:  Uterus:  normal size, contour, position, consistency, mobility, non-tender              Adnexa: normal adnexa and no mass, fullness, tenderness               Rectovaginal: Confirms               Anus:  normal sphincter tone, no lesions  Chaperone was present for exam.  A:  Well Woman with normal exam PMP, no HRT Meniere's disease Anxiety Osteopenia  P: Mammogram yearly. Colonoscopy discussed.  Pt declines.  Doing guiac tests with PCP. Estrace 1mg  (pt takes 1/2 tab daily) and Prometrium 200mg  daily 1-15 each month.  rx to pharmacy.  pap smear with neg HR HPV 11/13. Pap today. D/W pt how to wean off Zoloft.  She is not really sure she needs it now and she is coping better with this diagnosis. return annually or prn

## 2014-06-17 LAB — IPS PAP TEST WITH REFLEX TO HPV

## 2014-08-04 ENCOUNTER — Other Ambulatory Visit: Payer: Self-pay | Admitting: Obstetrics & Gynecology

## 2014-08-04 NOTE — Telephone Encounter (Signed)
Medication refill request: Progesterone Last AEX: 06/15/14 SM  Next AEX: 08/19/15 SM Last MMG (if hormonal medication request): 06/24/13 BIRADS1:Neg Refill authorized: 06/15/14 #15caps/ [facility]R [facility] [place]

## 2014-08-04 NOTE — Telephone Encounter (Signed)
Medication refill request: Estrace  Last AEX:  06/15/14 SM  Next AEX: 08/19/15 SM Last MMG (if hormonal medication request): 06/24/13 BIRADS1:Neg Refill authorized: 06/15/14 #30/ [facility] [place]

## 2015-04-21 ENCOUNTER — Ambulatory Visit: Payer: Managed Care, Other (non HMO)

## 2015-04-25 ENCOUNTER — Ambulatory Visit: Payer: Managed Care, Other (non HMO) | Admitting: Physical Therapy

## 2015-05-02 ENCOUNTER — Ambulatory Visit: Payer: Managed Care, Other (non HMO) | Attending: Otolaryngology | Admitting: Physical Therapy

## 2015-05-02 DIAGNOSIS — F4323 Adjustment disorder with mixed anxiety and depressed mood: Secondary | ICD-10-CM | POA: Diagnosis not present

## 2015-05-02 DIAGNOSIS — M542 Cervicalgia: Secondary | ICD-10-CM | POA: Diagnosis not present

## 2015-05-02 DIAGNOSIS — H8102 Meniere's disease, left ear: Secondary | ICD-10-CM | POA: Diagnosis not present

## 2015-05-02 DIAGNOSIS — M6281 Muscle weakness (generalized): Secondary | ICD-10-CM

## 2015-05-02 DIAGNOSIS — G4700 Insomnia, unspecified: Secondary | ICD-10-CM | POA: Diagnosis not present

## 2015-05-02 DIAGNOSIS — E559 Vitamin D deficiency, unspecified: Secondary | ICD-10-CM | POA: Diagnosis not present

## 2015-05-02 DIAGNOSIS — H832X2 Labyrinthine dysfunction, left ear: Secondary | ICD-10-CM | POA: Diagnosis present

## 2015-05-02 DIAGNOSIS — J342 Deviated nasal septum: Secondary | ICD-10-CM | POA: Insufficient documentation

## 2015-05-02 DIAGNOSIS — H905 Unspecified sensorineural hearing loss: Secondary | ICD-10-CM | POA: Diagnosis not present

## 2015-05-02 DIAGNOSIS — M436 Torticollis: Secondary | ICD-10-CM | POA: Insufficient documentation

## 2015-05-03 ENCOUNTER — Encounter: Payer: Self-pay | Admitting: Physical Therapy

## 2015-05-03 DIAGNOSIS — J342 Deviated nasal septum: Secondary | ICD-10-CM | POA: Insufficient documentation

## 2015-05-03 DIAGNOSIS — H905 Unspecified sensorineural hearing loss: Secondary | ICD-10-CM

## 2015-05-03 DIAGNOSIS — G4700 Insomnia, unspecified: Secondary | ICD-10-CM | POA: Insufficient documentation

## 2015-05-03 DIAGNOSIS — F4323 Adjustment disorder with mixed anxiety and depressed mood: Secondary | ICD-10-CM | POA: Insufficient documentation

## 2015-05-03 DIAGNOSIS — H8109 Meniere's disease, unspecified ear: Secondary | ICD-10-CM | POA: Insufficient documentation

## 2015-05-03 DIAGNOSIS — H9042 Sensorineural hearing loss, unilateral, left ear, with unrestricted hearing on the contralateral side: Secondary | ICD-10-CM | POA: Insufficient documentation

## 2015-05-03 DIAGNOSIS — E559 Vitamin D deficiency, unspecified: Secondary | ICD-10-CM | POA: Insufficient documentation

## 2015-05-03 DIAGNOSIS — IMO0001 Reserved for inherently not codable concepts without codable children: Secondary | ICD-10-CM | POA: Insufficient documentation

## 2015-05-03 NOTE — Therapy (Addendum)
[facility] [address] [place], [place], [address] Phone: [phone]   Fax:  [phone]  Physical Therapy Evaluation  Patient Details  Name: Jody Clarke MRN: [id number] Date of Birth: 1961/03/25 Referring Provider: Christia Reading, MD  Encounter Date: 05/02/2015    Past Medical History  Diagnosis Date  . Allergy     Allergic rhinitis  . Anxiety     Past Surgical History  Procedure Laterality Date  . 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 Tonsillectomy    . Knee surgery      There were no vitals filed for this visit.  Visit Diagnosis:  Stiffness of neck - Plan: PT plan of care cert/re-cert  Muscular weakness - Plan: PT plan of care cert/re-cert  Vestibular disequilibrium, left - Plan: PT plan of care cert/re-cert      Subjective Assessment - 05/17/15 1243    Subjective Pt diagnosed with Meniere's Disease in 2010; had shunt placement surgery at [facility] in summer 2014. Pt has not seen significant improvement since said surgery.   Patient-Stated Goals "To learn some things to help to relieve some of the neck tension and then to understand which symptoms are related to vestibular impairments, and manage those symptoms."            [facility] PT Assessment - 05/17/15 0001    Assessment   Medical Diagnosis cervical tension and imbalance   Onset Date/Surgical Date 04/16/08   Precautions   Precautions Fall   Restrictions   Weight Bearing Restrictions No   Prior Function   Level of Independence Independent   Vocation Full time employment   Art therapist at [employer]   Cognition   Overall Cognitive Status Within Functional Limits for tasks assessed   Sensation   Light Touch Appears Intact   Coordination   Gross Motor Movements are Fluid and Coordinated Yes   Posture/Postural Control   Posture/Postural Control Postural limitations    Postural Limitations Rounded Shoulders;Forward head;Increased thoracic kyphosis   Posture Comments lower cervical spine flexion; upper cervical spine extension   AROM   Overall AROM  Deficits   Cervical Flexion 48   Cervical Extension 46   Cervical - Right Side Bend 27   Cervical - Left Side Bend 24   Cervical - Right Rotation 58   Cervical - Left Rotation 60   Transfers   Transfers Sit to Stand;Stand to Sit   Sit to Stand 7: Independent   Stand to Sit 7: Independent   Ambulation/Gait   Ambulation/Gait Yes   Ambulation/Gait Assistance 7: Independent;5: Supervision   Ambulation Distance (Feet) 250 Feet   Assistive device None   Gait Pattern Within Functional Limits;Step-through pattern   Gait velocity 4.24 ft/sec   Standardized Balance Assessment   Standardized Balance Assessment Balance Master Testing   Balance Master Testing Sensory Organization Test   Balance Master Testing    Results Composite score = 78% compared to age/height matched normative value of 70%.  Pt sustained no "falls". Ability to utilize input from all systems is normal. Pt maintains COG anterior to BOS.            Vestibular Assessment - 05/17/15 0001    Occulomotor Exam   Comment Head Thrust Test: Unable to formally assess due to muscle guarding in cervical spine.  PT Short Term Goals - 05/02/15 1651    PT SHORT TERM GOAL #1   Title Pt will perform initial home exercise program with mod I using paper handout to maximize functional gains made in PT. Target date: 05/23/15   PT SHORT TERM GOAL #2   Title Complete Neck Disability Index to assess pt-perceived disability due to cervical spine symptoms. Target date: 05/23/15   PT SHORT TERM GOAL #3   Title Pt will improve active cervical spine lateral flexion to >/= 33 degrees bilaterally to progress toward more normalized ROM. Target date: 05/23/15   Baseline Lateral flexion AROM: 24 degrees to L, 27 degrees to R.   PT  SHORT TERM GOAL #4   Title Pt will improve active cervical spine rotation to >/= 67 degrees to increase safety with driving. Target date: 05/23/15   Baseline Rotation AROM: 58 degrees to R, 60 degrees to L   PT SHORT TERM GOAL #5   Title Initiate education on fall prevention strategies (focus on multisensory environment) to decrease fall risk in home environment. Target date: 05/23/15           PT Long Term Goals - 05/02/15 1700    PT LONG TERM GOAL #1   Title Pt will be independent with home exercise program to maximize functional gains made in PT. Target date: 06/13/15   PT LONG TERM GOAL #2   Title Improve Neck Disability Index by 15% (or 7.5 points) from baseline to indicate significant decrease in pt-perceived disability due to cervical spine symptoms  Target date: 06/13/15   PT LONG TERM GOAL #3   Title Pt will improve active cervical spine lateral flexion to >/= 40 degrees bilaterally to indicate improved muscle extensibility/balance. Target date: 06/13/15   Baseline Baseline: 24 degrees to L, 27 degrees to R   PT LONG TERM GOAL #4   Title Pt will improve active cervical spine rotation to >/= 75 degrees to maximize safety with driving.  Target date: 06/13/15   Baseline Baseline: 58 degrees to R, 60 degrees to L   PT LONG TERM GOAL #5   Title Pt will perform Craniocervical Flexion Test for 5 increments without compensation to indicate increased endurance in deep neck flexor muscles. Target date: 06/13/15   Additional Long Term Goals   Additional Long Term Goals Yes   PT LONG TERM GOAL #6   Title Pt will decrease DHI score from 52 to </= 34 to indicate significant decrease in pt-perceived disability due to dizziness. Target date: 06/13/15               Plan - 05/02/15 1640    Clinical Impression Statement Pt is a 55 y/o F referred to outpatient PT to address cervical tension and imbalance. PMH significant for active Meniere's Disease and anxiety. PT evaluation reveals the following  impairments: impaired posture; limited cervical spine AROM; postural impairments; limited activation/muscular endurance in deep neck flexor muscles; episodes of imbalance associated with Meniere's Disease; requires supervision for obstacle negotiation during ambulation; and patient-perceived disability due to dizziness, as indicated by DHI score. Pt will benefit from outpatient PT 2x/week for 6 weeks to address said impairments.   Pt will benefit from skilled therapeutic intervention in order to improve on the following deficits Dizziness;Other (comment);Abnormal gait;Decreased balance;Decreased endurance;Decreased range of motion;Decreased strength;Impaired flexibility;Postural dysfunction;Hypomobility  Dizziness, imbalance, gait impairments not present on eval, but is episodic due to Meniere's Disease   Rehab Potential Good   Clinical Impairments Affecting Rehab Potential active  Meniere's Disease   PT Frequency 2x / week   PT Duration 6 weeks   PT Treatment/Interventions ADLs/Self Care Home Management;Moist Heat;Vestibular;Balance training;Therapeutic exercise;Therapeutic activities;Functional mobility training;Stair training;Gait training;Neuromuscular re-education;Patient/family education;Manual techniques;Passive range of motion   PT Next Visit Plan Complete NDI (attempted on eval but no score on FOTO). Initiate HEP.   Consulted and Agree with Plan of Care Patient         Problem List Patient Active Problem List   Diagnosis Date Noted  . Adjustment disorder with anxiety 05/03/2015  . Cochleovestibular active Meniere's disease 05/03/2015  . 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 Insomnia 05/03/2015  . Vitamin D deficiency 05/03/2015  . Deviated nasal septum 05/03/2015    Jorje Guild, PT, DPT [facility] [address] [place], [place], [address] Phone: [phone]   Fax:  [phone] 05/17/2015, 8:37 PM   Name: Jody Clarke MRN: [id number] Date of Birth: 08/06/1960

## 2015-05-09 ENCOUNTER — Telehealth: Payer: Self-pay | Admitting: Physical Therapy

## 2015-05-09 NOTE — Telephone Encounter (Signed)
Entry made in error

## 2015-05-10 ENCOUNTER — Ambulatory Visit: Payer: Managed Care, Other (non HMO) | Admitting: Physical Therapy

## 2015-05-12 ENCOUNTER — Ambulatory Visit: Payer: Managed Care, Other (non HMO) | Admitting: Physical Therapy

## 2015-05-17 ENCOUNTER — Ambulatory Visit: Payer: Managed Care, Other (non HMO) | Admitting: Rehabilitative and Restorative Service Providers"

## 2015-05-17 DIAGNOSIS — M6281 Muscle weakness (generalized): Secondary | ICD-10-CM

## 2015-05-17 DIAGNOSIS — H832X2 Labyrinthine dysfunction, left ear: Secondary | ICD-10-CM

## 2015-05-17 DIAGNOSIS — M436 Torticollis: Secondary | ICD-10-CM | POA: Diagnosis not present

## 2015-05-17 NOTE — Patient Instructions (Signed)
Gaze Stabilization: Tip Card  1.Target must remain in focus, not blurry, and appear stationary while head is in motion. 2.Perform exercises with small head movements (45 to either side of midline). 3.Increase speed of head motion so long as target is in focus. 4.If you wear eyeglasses, be sure you can see target through lens (therapist will give specific instructions for bifocal / progressive lenses). 5.These exercises may provoke dizziness or nausea. Work through these symptoms. If too dizzy, slow head movement slightly. Rest between each exercise. 6.Exercises demand concentration; avoid distractions. 7.For safety, perform standing exercises close to a counter, wall, corner, or next to someone.  Copyright  VHI. All rights reserved.  Gaze Stabilization: Sitting    Keeping eyes on target on wall 3-5 feet away, tilt head down slightly and move head side to side for _*work up to 30*___ seconds. Repeat 3 times per day.  Copyright  VHI. All rights reserved.   Feet Partial Heel-Toe (Compliant Surface) Head Motion - Eyes Open    With eyes open, standing on compliant surface: __pillow *in the corner*______, right foot partially in front of the other, move head slowly: up and down.  Rest and switch feet, then perform with head side to side. Repeat __10 head turns each direction per session. Do _2___ sessions per day.  Copyright  VHI. All rights reserved.   Turning in Place: Solid Surface    Standing in place, lead with head and turn slowly making full turns toward left.  Spot something.  Then turn to the right.  Wait until dizziness settles.  Repeat 3 times to each direction (alternating right and left turns). Repeat __3__ times per session. Do _2___ sessions per day.  Copyright  VHI. All rights reserved.   Scapular Retraction (Prone)    Lie with arms at sides. PALMS TOWARDS THE FLOOR. Pinch shoulder blades together and raise arms a few inches from floor. Repeat _10___ times per  set. Do __2__ sets per session. Do __2__ sessions per day.  http://orth.exer.us/955   Copyright  VHI. All rights reserved.   Stretch Break - Chin Tuck     Lie down on your back with a towel roll at base of head.  Press straight back into the towel roll.  Hold for 2-3 seconds.  Repeat 10 times. 2 times per day.  Copyright  VHI. All rights reserved.

## 2015-05-18 NOTE — Therapy (Signed)
[facility] [address] [place], [place], [address] Phone: [phone]   Fax:  [phone]  Physical Therapy Treatment  Patient Details  Name: Jody Clarke MRN: [id number] Date of Birth: [birth year] Referring Provider: Christia Reading, MD  Encounter Date: 05/17/2015      PT End of Session - 05/18/15 1422    Visit Number 2   Number of Visits 13   Date for PT Re-Evaluation 06/16/15   Authorization Type [employer]   Authorization - Visit Number 1   Authorization - Number of Visits 60   PT Start Time 1239   PT Stop Time 1320   PT Time Calculation (min) 41 min   Equipment Utilized During Treatment Gait belt   Activity Tolerance Patient tolerated treatment well   Behavior During Therapy WFL for tasks assessed/performed      Past Medical History  Diagnosis Date  . Allergy     Allergic rhinitis  . Anxiety     Past Surgical History  Procedure Laterality Date  . 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 Tonsillectomy    . Knee surgery      There were no vitals filed for this visit.  Visit Diagnosis:  Stiffness of neck  Vestibular disequilibrium, left  Muscular weakness      Subjective Assessment - 05/17/15 1243    Subjective The patient reports recently having a stomach bug.  She reports overall neck tightness day to day.  She reports limitations in functional tasks of quick head motion, scanning in busy environments, and being able to turn quickly.                         [facility] Adult PT Treatment/Exercise - 05/18/15 1526    Self-Care   Self-Care Other Self-Care Comments   Other Self-Care Comments  Patient demonstrated prior stretching for cervical spine from PT.  We discussed emphasized correct postural technique for shoulder depression during activities.     Exercises   Exercises Other Exercises   Other Exercises  Chin tuck supine with towel roll for tactile cue x 5-10  reps, prone horizontal abduction/scap retraction with tactile cues x 10 reps focusing on the left side> R side.  Prone superman x 5 reps, however too great upper trap compensation on Left side.  Arms at neutral scapular depression/retraction with tactile cues for scapular adduction. Shoulder isolated ER with elbow propping.          Vestibular Treatment/Exercise - 05/18/15 0001    Vestibular Treatment/Exercise   Vestibular Treatment Provided Habituation;Gaze   Habituation Exercises 180 degree Turns;360 degree Turns;Standing Horizontal Head Turns;Standing Vertical Head Turns   Gaze Exercises X1 Viewing Horizontal   Standing Horizontal Head Turns   Number of Reps  10   Symptom Description  with feet positioned in feet together> progressing to partial heel/toe on compliant surfaces.  PT provided verbal cues to increase speed of head turns and avoid stopping in the middle.   Standing Vertical Head Turns   Number of Reps  10   Symptom Description  Same foot position as horizontal head turns   180 degree Turns   Number of Reps  3   Symptom Description  At counter R<>L turns with visual fixation.  Minimal c/o dizziness   360 degree Turns   Number of Reps  3   Symptom Description  R<>L turns with visual fixation   X1 Viewing Horizontal   Foot  Position seated   Reps 3  performed to patient tolerance   Comments Patient needs cues to maintain fixation on target at 3 feet.               PT Education - 05/18/15 1448    Education provided Yes   Education Details HEP: scapular retraction, chin tucks, VOR x 1, turns in standing, corner standing with head turns.   Person(s) Educated Patient   Methods Explanation;Demonstration;Handout   Comprehension Verbalized understanding;Returned demonstration          PT Short Term Goals - 05/02/15 1651    PT SHORT TERM GOAL #1   Title Pt will perform initial home exercise program with mod I using paper handout to maximize functional gains made  in PT. Target date: 05/23/15   PT SHORT TERM GOAL #2   Title Complete Neck Disability Index to assess pt-perceived disability due to cervical spine symptoms. Target date: 05/23/15   PT SHORT TERM GOAL #3   Title Pt will improve active cervical spine lateral flexion to >/= 33 degrees bilaterally to progress toward more normalized ROM. Target date: 05/23/15   Baseline Lateral flexion AROM: 24 degrees to L, 27 degrees to R.   PT SHORT TERM GOAL #4   Title Pt will improve active cervical spine rotation to >/= 67 degrees to increase safety with driving. Target date: 05/23/15   Baseline Rotation AROM: 58 degrees to R, 60 degrees to L   PT SHORT TERM GOAL #5   Title Initiate education on fall prevention strategies (focus on multisensory environment) to decrease fall risk in home environment. Target date: 05/23/15   Additional Short Term Goals   Additional Short Term Goals --   PT SHORT TERM GOAL #6   Title --   PT SHORT TERM GOAL #7   Title             PT Long Term Goals - 05/02/15 1700    PT LONG TERM GOAL #1   Title Pt will be independent with home exercise program to maximize functional gains made in PT. Target date: 06/13/15   PT LONG TERM GOAL #2   Title Improve Neck Disability Index by 15% (or 7.5 points) from baseline to indicate significant decrease in pt-perceived disability due to cervical spine symptoms  Target date: 06/13/15   PT LONG TERM GOAL #3   Title Pt will improve active cervical spine lateral flexion to >/= 40 degrees bilaterally to indicate improved muscle extensibility/balance. Target date: 06/13/15   Baseline Baseline: 24 degrees to L, 27 degrees to R   PT LONG TERM GOAL #4   Title Pt will improve active cervical spine rotation to >/= 75 degrees to maximize safety with driving.  Target date: 06/13/15   Baseline Baseline: 58 degrees to R, 60 degrees to L   PT LONG TERM GOAL #5   Title Pt will perform Craniocervical Flexion Test for 5 increments without compensation to indicate  increased endurance in deep neck flexor muscles. Target date: 06/13/15   Additional Long Term Goals   Additional Long Term Goals Yes   PT LONG TERM GOAL #6   Title Pt will decrease DHI score from 52 to </= 34 to indicate significant decrease in pt-perceived disability due to dizziness. 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  The patient avoids quick head movements.  PT today emphasized gaze adaptation, habituation of head turns and 360 degree turns to improve vestibular adaptation while emphasizing neck stretching.  Addressing vestibular issues should improve long term outcome of neck ROM and posture.   PT Next Visit Plan Patient to bring NDI to cliic, check HEP.  Focus on: neck stretching/ posture stabilization, gaze x 1 viewing progression, habituation, scanning environments during gait, and behavioral adjustments to diminish muscle guarding with chronic UVH.   Consulted and Agree with Plan of Care Patient        Problem List Patient Active Problem List   Diagnosis Date Noted  . Adjustment disorder with anxiety 05/03/2015  . Cochleovestibular active Meniere's disease 05/03/2015  . 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 Insomnia 05/03/2015  . Vitamin D deficiency 05/03/2015  . Deviated nasal septum 05/03/2015    Wolf Boulay, PT 05/18/2015, 3:35 PM  [facility] [address] [place], [place], [address] Phone: [phone]   Fax:  [phone]  Name: Lowella Kindley MRN: [id number] Date of Birth: 03-22-1961

## 2015-05-19 ENCOUNTER — Ambulatory Visit: Payer: Managed Care, Other (non HMO) | Attending: Otolaryngology | Admitting: Physical Therapy

## 2015-05-19 DIAGNOSIS — M6281 Muscle weakness (generalized): Secondary | ICD-10-CM | POA: Diagnosis present

## 2015-05-19 DIAGNOSIS — M436 Torticollis: Secondary | ICD-10-CM | POA: Insufficient documentation

## 2015-05-19 DIAGNOSIS — H832X2 Labyrinthine dysfunction, left ear: Secondary | ICD-10-CM | POA: Diagnosis present

## 2015-05-19 NOTE — Patient Instructions (Addendum)
Gaze Stabilization: Tip Card  1.Target must remain in focus, not blurry, and appear stationary while head is in motion. You may need to decrease the amplitude of motion. 2.Perform exercises with small head movements (45 to either side of midline). 3.Increase speed of head motion so long as target is in focus. 4.If you wear eyeglasses, be sure you can see target through lens (therapist will give specific instructions for bifocal / progressive lenses). 5.These exercises may provoke dizziness or nausea. Work through these symptoms. If too dizzy, slow head movement slightly. Rest between each exercise. 6.Exercises demand concentration; avoid distractions. 7.For safety, perform standing exercises close to a counter, wall, corner, or next to someone.  Copyright  VHI. All rights reserved.  Gaze Stabilization: Sitting    Keeping eyes on target on wall 3-5 feet away, tilt head down slightly and move head side to side for _*work up to 30*___ seconds. Repeat 3 times per day.  Copyright  VHI. All rights reserved.   Feet Partial Heel-Toe (Compliant Surface) Head Motion - Eyes Open    With eyes open, standing on compliant surface: __pillow *in the corner*______, right foot partially in front of the other, move head slowly: up and down. Rest and switch feet, then perform with head side to side. Repeat __10 head turns each direction per session. Do _2___ sessions per day.  Copyright  VHI. All rights reserved.   Turning in Place: Solid Surface    Standing in place, lead with head and turn slowly making full turns toward left. Spot something. Then turn to the right. Wait until dizziness settles. Repeat 3 times to each direction (alternating right and left turns). Repeat __3__ times per session. Do _2___ sessions per day.  Copyright  VHI. All rights reserved.   Scapular Retraction (Prone)    Lie with arms at sides. PALMS TOWARDS THE FLOOR. Pinch shoulder blades together and raise  SHOULDERS ONLY a few inches from floor. Repeat _10___ times per set. Do __2__ sets per session. Do __2__ sessions per day.  http://orth.exer.us/955   Copyright  VHI. All rights reserved.   Stretch Break - Chin Tuck     Lie down on your back with a towel roll at base of head. Press straight back into the towel roll. Hold for 2-3 seconds. Repeat 10 times. 2 times per day.  Try adding gentle rotations left and right without lifting your head off of the pillow.  Copyright  VHI. All rights reserved.

## 2015-05-19 NOTE — Therapy (Signed)
[facility] [address] [place], [place], [address] Phone: [phone]   Fax:  [phone]  Physical Therapy Treatment  Patient Details  Name: Jody Clarke MRN: [id number] Date of Birth: [birth year] Referring Provider: Christia Reading, MD  Encounter Date: 05/19/2015      PT End of Session - 05/19/15 1524    Visit Number 3   Number of Visits 13   Date for PT Re-Evaluation 06/16/15   Authorization Type [employer]   Authorization - Visit Number 1   Authorization - Number of Visits 60   PT Start Time 1403   PT Stop Time 1449   PT Time Calculation (min) 46 min   Activity Tolerance Patient tolerated treatment well   Behavior During Therapy [facility] for tasks assessed/performed      Past Medical History  Diagnosis Date  . Allergy     Allergic rhinitis  . Anxiety     Past Surgical History  Procedure Laterality Date  . 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 Tonsillectomy    . Knee surgery      There were no vitals filed for this visit.  Visit Diagnosis:  Stiffness of neck  Vestibular disequilibrium, left  Muscular weakness      Subjective Assessment - 05/19/15 1406    Subjective The patient reports no significant changes since last session. Reports she was compliant with HEP. Has some tightness in L upper trap region.   Pertinent History Meniere's Disease, anxiety, depression   Patient Stated Goals "To learn some things to help to relieve some of the neck tension and then to understand which symptoms are related to vestibular impairments, and manage those symptoms."            [facility] Adult PT Treatment/Exercise - 05/19/15 1511    Exercises   Exercises Other Exercises   Other Exercises  Chin tuck x10 with pillow tactile cue behind head and neck. Supine cervical rotatoin L/R x5 each with verbal and tactile cues to limit cervical flexion, perform chin tuck, and relax anterior neck musculature.  Prone scapular retraction/depression with no shoulder extension and verbal and tactile cues for pt to "lift your shoulders from my hands."     Manual Therapy   Manual Therapy Joint mobilization;Myofascial release;Passive ROM   Joint Mobilization With patient in supine, performed up glides grade 1-2 joint mobilizations x30 sec cervical segments   Myofascial Release With patient in supine, soft tissue massage to L upper traps, levator scapulae, scalenes   Passive ROM L upper trap/levator scap stretch with pt in supine         Vestibular Treatment/Exercise - 05/19/15 1515    Vestibular Treatment/Exercise   Vestibular Treatment Provided Habituation;Gaze   Habituation Exercises 180 degree Turns;Seated Horizontal Head Turns   Gaze Exercises X1 Viewing Horizontal   Seated Horizontal Head Turns   Number of Reps  3  x30 sec   Symptom Description  Pt instructed to modify x1 viewing to keep target in focus by decreasing speed and amplitude of movement   180 degree Turns   Number of Reps  6   Symptom Description  At counter pt performed R and L turns with visual fixation. Modified from step turns to tandem pivot turn to simulate turns in clogging class.            PT Education - 05/19/15 1522    Education provided Yes   Education Details HEP modified for prone scapular retraction  without shoulder extension/arm lift to facilitate further scapular retraction/depression, added cervical rotation with emphasis on decreasing activation of anterior neck musculature. Modified gaze x1 horizontal head turns to smaller amplitude of movement.   Person(s) Educated Patient   Methods Explanation;Demonstration;Handout   Comprehension Verbalized understanding;Returned demonstration          PT Short Term Goals - 05/19/15 1536    PT SHORT TERM GOAL #1   Title Pt will perform initial home exercise program with mod I using paper handout to maximize functional gains made in PT. Target date: 05/23/15   PT  SHORT TERM GOAL #2   Title Complete Neck Disability Index to assess pt-perceived disability due to cervical spine symptoms. Target date: 05/23/15   Baseline NDI = 42%, raw = 21 received 05/19/15   PT SHORT TERM GOAL #3   Title Pt will improve active cervical spine lateral flexion to >/= 33 degrees bilaterally to progress toward more normalized ROM. Target date: 05/23/15   Baseline Lateral flexion AROM: 24 degrees to L, 27 degrees to R.   PT SHORT TERM GOAL #4   Title Pt will improve active cervical spine rotation to >/= 67 degrees to increase safety with driving. Target date: 05/23/15   Baseline Rotation AROM: 58 degrees to R, 60 degrees to L   PT SHORT TERM GOAL #5   Title Initiate education on fall prevention strategies (focus on multisensory environment) to decrease fall risk in home environment. Target date: 05/23/15   PT SHORT TERM GOAL #7   Title             PT Long Term Goals - 05/19/15 1539    PT LONG TERM GOAL #1   Title Pt will be independent with home exercise program to maximize functional gains made in PT. Target date: 06/13/15   PT LONG TERM GOAL #2   Title Improve Neck Disability Index by 15% (or 7.5 points) from baseline to indicate significant decrease in pt-perceived disability due to cervical spine symptoms  Target date: 06/13/15   Baseline NDI = 42%, raw = 21 received 05/19/15   PT LONG TERM GOAL #3   Title Pt will improve active cervical spine lateral flexion to >/= 40 degrees bilaterally to indicate improved muscle extensibility/balance. Target date: 06/13/15   Baseline Baseline: 24 degrees to L, 27 degrees to R   PT LONG TERM GOAL #4   Title Pt will improve active cervical spine rotation to >/= 75 degrees to maximize safety with driving.  Target date: 06/13/15   Baseline Baseline: 58 degrees to R, 60 degrees to L   PT LONG TERM GOAL #5   Title Pt will perform Craniocervical Flexion Test for 5 increments without compensation to indicate increased endurance in deep neck flexor  muscles. Target date: 06/13/15   PT LONG TERM GOAL #6   Title Pt will decrease DHI score from 52 to </= 34 to indicate significant decrease in pt-perceived disability due to dizziness. Target date: 06/13/15               Plan - 05/19/15 1525    Clinical Impression Statement The patient has continued neck tightness/discomfort on L. Manual techniques used to improve mobility and soft tissue flexibility with limited results. Pt demonstrates overactivity of anterior cervical musculature and initial movement into cervical flexion when asked to perform cervical rotation, though she is able to decrease muscle activity with verbal and tactile cueing for activation of deep neck flexors. Continues to have difficulty with performance  of gaze x1, modified to smaller amplitude movements for appropriate performance.    Pt will benefit from skilled therapeutic intervention in order to improve on the following deficits Dizziness;Other (comment);Abnormal gait;Decreased balance;Decreased endurance;Decreased range of motion;Decreased strength;Impaired flexibility;Postural dysfunction;Hypomobility   Rehab Potential Good   Clinical Impairments Affecting Rehab Potential active Meniere's Disease   PT Frequency 2x / week   PT Duration 6 weeks   PT Treatment/Interventions ADLs/Self Care Home Management;Moist Heat;Vestibular;Balance training;Therapeutic exercise;Therapeutic activities;Functional mobility training;Stair training;Gait training;Neuromuscular re-education;Patient/family education;Manual techniques;Passive range of motion   PT Next Visit Plan manual techniques for L upper trap/levator scap, joint mobilizations to improve cervical sidebending, gaze x 1 viewing progression, habituation, scanning environments during gait, and behavioral adjustments to diminish muscle guarding with chronic UVH.   Consulted and Agree with Plan of Care Patient        Problem List Patient Active Problem List   Diagnosis Date  Noted  . Adjustment disorder with anxiety 05/03/2015  . Cochleovestibular active Meniere's disease 05/03/2015  . 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 Insomnia 05/03/2015  . Vitamin D deficiency 05/03/2015  . Deviated nasal septum 05/03/2015    Celene Squibb, SPT 05/19/2015, 3:45 PM  [facility] [address] [place], [place], [address] Phone: [phone]   Fax:  [phone]  Name: Jody Clarke MRN: [id number] Date of Birth: June 15, 1960

## 2015-05-23 ENCOUNTER — Encounter: Payer: Self-pay | Admitting: Rehabilitative and Restorative Service Providers"

## 2015-05-23 ENCOUNTER — Ambulatory Visit: Payer: Managed Care, Other (non HMO) | Admitting: Rehabilitative and Restorative Service Providers"

## 2015-05-23 DIAGNOSIS — M436 Torticollis: Secondary | ICD-10-CM

## 2015-05-23 DIAGNOSIS — H832X2 Labyrinthine dysfunction, left ear: Secondary | ICD-10-CM

## 2015-05-23 NOTE — Patient Instructions (Signed)
Gaze Stabilization: Standing Feet Partial Heel-Toe    Feet in partial heel-toe position, keeping eyes fixed on target on wall _3-5___ feet away, tilt head down slightly and move head side to side for _30___ seconds.  Do __2-3__ sessions per day. Repeat using target on pattern background.  Copyright  VHI. All rights reserved.   Continue doing this exercise with feet apart, as well.  BALL TOSS: Standing work on tossing a ball from right to left hands tracking the ball with head and eye movement.

## 2015-05-23 NOTE — Therapy (Signed)
[facility] [address] [place], [place], [address] Phone: [phone]   Fax:  [phone]  Physical Therapy Treatment  Patient Details  Name: Jody Clarke MRN: [id number] Date of Birth: 09/01/1960 Referring Provider: Christia Reading, MD  Encounter Date: 05/23/2015      PT End of Session - 05/23/15 1429    Visit Number 4   Number of Visits 13   Date for PT Re-Evaluation 06/16/15   Authorization Type [employer]   Authorization - Visit Number 1   Authorization - Number of Visits 60   PT Start Time 1235   PT Stop Time 1317   PT Time Calculation (min) 42 min   Equipment Utilized During Treatment Gait belt   Activity Tolerance Patient tolerated treatment well   Behavior During Therapy WFL for tasks assessed/performed      Past Medical History  Diagnosis Date  . Allergy     Allergic rhinitis  . Anxiety     Past Surgical History  Procedure Laterality Date  . 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 Tonsillectomy    . Knee surgery      There were no vitals filed for this visit.  Visit Diagnosis:  No diagnosis found.      Subjective Assessment - 05/23/15 1238    Subjective The patient reports that therapy is getting her out of her comfort zone.    Pertinent History Meniere's Disease, anxiety, depression   Patient Stated Goals "To learn some things to help to relieve some of the neck tension and then to understand which symptoms are related to vestibular impairments, and manage those symptoms."   Currently in Pain? No/denies             [facility] Adult PT Treatment/Exercise - 05/23/15 1358    Neuro Re-ed    Neuro Re-ed Details  --   Manual Therapy   Manual Therapy Soft tissue mobilization   Manual therapy comments The following performed with the pt in supine: positional release of L upper trap/levator scap/scalenes x10 min. The following performed with the pt in R sidelying: positional  release of L peri-scapular musculature, L levator scap origin x 10 min         Vestibular Treatment/Exercise - 05/23/15 0001    Vestibular Treatment/Exercise   Gaze Exercises X1 Viewing Horizontal;Comment   Standing Horizontal Head Turns   Symptom Description  The following performed with min guard for pt safety: gaze x1 standing 2x30 sec; gaze x1 tandem stance R/L 2x20 sec each.   Eye/Head Exercise Vertical   Comment Pt performed ambulation 115' while performing ball toss L/R to facilitate head turns with visual tracking while walking. Pt required verbal cues for increased amplitude of throws and to increase cervical ROM vs strictly eye movement. Pt performed ambulation x100' while performing horizontal ball toss on L to PT behind, catch ball on R side from PT throw. Pt ambulated 100' while performing ball toss R to PT behind, catching ball on L from PT behind  in order to facilitate head turns and visual tracking while walking and simulate dance classes.            PT Education - 05/23/15 1426    Education provided Yes   Education Details Pt instructed to hold cervical stretches she is performing at home in order to see if she has decrease in cervical pain. Pt educated on use of heat 15-20 min at home for cervical muscle relaxation.  Pt also educated on behavioral adaptations associated with Meniere's Disease including guarding of cervical ROM during completion of tasks and how this may be contributing to cervical pain.   Person(s) Educated Patient   Methods Explanation;Verbal cues   Comprehension Verbalized understanding          PT Short Term Goals - 05/19/15 1536    PT SHORT TERM GOAL #1   Title Pt will perform initial home exercise program with mod I using paper handout to maximize functional gains made in PT. Target date: 05/23/15   PT SHORT TERM GOAL #2   Title Complete Neck Disability Index to assess pt-perceived disability due to cervical spine symptoms. Target date: 05/23/15    Baseline NDI = 42%, raw = 21 received 05/19/15   PT SHORT TERM GOAL #3   Title Pt will improve active cervical spine lateral flexion to >/= 33 degrees bilaterally to progress toward more normalized ROM. Target date: 05/23/15   Baseline Lateral flexion AROM: 24 degrees to L, 27 degrees to R.   PT SHORT TERM GOAL #4   Title Pt will improve active cervical spine rotation to >/= 67 degrees to increase safety with driving. Target date: 05/23/15   Baseline Rotation AROM: 58 degrees to R, 60 degrees to L   PT SHORT TERM GOAL #5   Title Initiate education on fall prevention strategies (focus on multisensory environment) to decrease fall risk in home environment. Target date: 05/23/15   PT SHORT TERM GOAL #7   Title             PT Long Term Goals - 05/19/15 1539    PT LONG TERM GOAL #1   Title Pt will be independent with home exercise program to maximize functional gains made in PT. Target date: 06/13/15   PT LONG TERM GOAL #2   Title Improve Neck Disability Index by 15% (or 7.5 points) from baseline to indicate significant decrease in pt-perceived disability due to cervical spine symptoms  Target date: 06/13/15   Baseline NDI = 42%, raw = 21 received 05/19/15   PT LONG TERM GOAL #3   Title Pt will improve active cervical spine lateral flexion to >/= 40 degrees bilaterally to indicate improved muscle extensibility/balance. Target date: 06/13/15   Baseline Baseline: 24 degrees to L, 27 degrees to R   PT LONG TERM GOAL #4   Title Pt will improve active cervical spine rotation to >/= 75 degrees to maximize safety with driving.  Target date: 06/13/15   Baseline Baseline: 58 degrees to R, 60 degrees to L   PT LONG TERM GOAL #5   Title Pt will perform Craniocervical Flexion Test for 5 increments without compensation to indicate increased endurance in deep neck flexor muscles. Target date: 06/13/15   PT LONG TERM GOAL #6   Title Pt will decrease DHI score from 52 to </= 34 to indicate significant decrease in  pt-perceived disability due to dizziness. Target date: 06/13/15               Plan - 05/23/15 1432    Clinical Impression Statement The patient continues to complain of pain/tightness in L levator scap origin/L peri-scapular musculature. Manual therapy soft tissue mobilization of L cervical musculature and L peri-scapular musculature yielded improvements in cervical sidebending. Continues to demonstrate limited scapular motor control with scapular retraction The pt demonstrated improved visual tracking with head turns with no reports of increased dizziness during today's session. Continue POC.   Pt will benefit from skilled  therapeutic intervention in order to improve on the following deficits Dizziness;Other (comment);Abnormal gait;Decreased balance;Decreased endurance;Decreased range of motion;Decreased strength;Impaired flexibility;Postural dysfunction;Hypomobility   Rehab Potential Good   Clinical Impairments Affecting Rehab Potential active Meniere's Disease   PT Frequency 2x / week   PT Duration 6 weeks   PT Treatment/Interventions ADLs/Self Care Home Management;Moist Heat;Vestibular;Balance training;Therapeutic exercise;Therapeutic activities;Functional mobility training;Stair training;Gait training;Neuromuscular re-education;Patient/family education;Manual techniques;Passive range of motion   PT Next Visit Plan soft tissue mobilization to L peri-scapular musculature, consider L subscapularis release,    Consulted and Agree with Plan of Care Patient        Problem List Patient Active Problem List   Diagnosis Date Noted  . Adjustment disorder with anxiety 05/03/2015  . Cochleovestibular active Meniere's disease 05/03/2015  . 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 Insomnia 05/03/2015  . Vitamin D deficiency 05/03/2015  . Deviated nasal septum 05/03/2015    Celene Squibb, SPT 05/23/2015, 3:48 PM  [facility] [address] [place], [place], [address] Phone: [phone]   Fax:  [phone]  Name: Rossetta Kama MRN: [id number] Date of Birth: 15-Jun-1960

## 2015-05-26 ENCOUNTER — Ambulatory Visit: Payer: Managed Care, Other (non HMO) | Admitting: Rehabilitative and Restorative Service Providers"

## 2015-05-26 DIAGNOSIS — M6281 Muscle weakness (generalized): Secondary | ICD-10-CM

## 2015-05-26 DIAGNOSIS — M436 Torticollis: Secondary | ICD-10-CM

## 2015-05-26 DIAGNOSIS — H832X2 Labyrinthine dysfunction, left ear: Secondary | ICD-10-CM

## 2015-05-26 NOTE — Therapy (Addendum)
[facility] [address] [place], [place], [address] Phone: [phone]   Fax:  [phone]  Physical Therapy Treatment  Patient Details  Name: Jody Clarke MRN: [id number] Date of Birth: Aug 30, 1960 Referring Provider: Christia Reading, MD  Encounter Date: 05/26/2015      PT End of Session - 05/26/15 1233    Visit Number 5   Number of Visits 13   Date for PT Re-Evaluation 06/16/15   Authorization Type [employer]   Authorization - Visit Number n/a   Authorization - Number of Visits 60   PT Start Time 1102   PT Stop Time 1148   PT Time Calculation (min) 46 min   Equipment Utilized During Treatment Gait belt   Activity Tolerance Patient tolerated treatment well   Behavior During Therapy WFL for tasks assessed/performed      Past Medical History  Diagnosis Date  . Allergy     Allergic rhinitis  . Anxiety     Past Surgical History  Procedure Laterality Date  . 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 Tonsillectomy    . Knee surgery      There were no vitals filed for this visit.  Visit Diagnosis:  Stiffness of neck  Muscular weakness  Vestibular disequilibrium, left      Subjective Assessment - 05/26/15 1103    Subjective Pt reports she is more "swimmy-headed" today from her allergies. Was able to garden yesterday without increased discomfort in neck. Got heating pad and used which helped her "relax."   Pertinent History Meniere's Disease, anxiety, depression   Patient Stated Goals "To learn some things to help to relieve some of the neck tension and then to understand which symptoms are related to vestibular impairments, and manage those symptoms."   Currently in Pain? Yes   Pain Score 5    Pain Location Neck   Pain Orientation Left   Pain Descriptors / Indicators Aching   Pain Type Chronic pain   Pain Onset More than a month ago   Pain Frequency Constant   Pain Relieving Factors  heating pad            [facility] Adult PT Treatment/Exercise - 05/26/15 1221    Neuro Re-ed    Neuro Re-ed Details  Pt performed modified plank with slight weight shift forward through extended arms 10x5 second holds with emphasis on relaxing UT/shoulder hike. Pt required verbal and tactile cues to initiate posterior pelvic tilt and reduce shoulder protraction and thoracic kyphosis to promote proper spinal alignment and awareness. Pt performed modified side plank L with elbow prop only 10x5 sec holdsand verbal and tactile cues to perform L scapular retraction/depression in order to create improved motor control and awareness of scapular stabilizers.    Manual Therapy   Manual Therapy Joint mobilization;Soft tissue mobilization   Joint Mobilization The following performed with the pt in supine: upglides grade 2 to R cervical vertebrae 2x30 sec each with passive L cervical sidebending and rotation. Upglides grade 2 to L cervical vertebrae 2x30 sec each with passive R cervical sidebending and rotation.   Soft tissue mobilization The following performed with pt in supine: STM L cervical paraspinals C2-C6, L UT. Positional release L cervical parapsinals.         Vestibular Treatment/Exercise - 05/26/15 0001    Vestibular Treatment/Exercise   Gaze Exercises X1 Viewing Horizontal;Comment   Standing Horizontal Head Turns   Symptom Description  The following performed with min guard  for pt safety: gaze x1 standing 2x30 sec; gaze x1 tandem stance R/L 2x20 sec each.            PT Education - 05/26/15 1232    Education provided Yes   Education Details Pt instructed to perform modified plank and modified side plank with emphasis on scapular stabilizer awareness and control as part of HEP. Pt instructed to continue supine chin tuck exercise at home with emphasis on increased hold time to improve muscular endurance in anticipation of progression to seated at next session.    Person(s) Educated Patient    Methods Explanation;Verbal cues;Tactile cues;Handout   Comprehension Verbalized understanding;Returned demonstration;Verbal cues required;Tactile cues required          PT Short Term Goals - 05/19/15 1536    PT SHORT TERM GOAL #1   Title Pt will perform initial home exercise program with mod I using paper handout to maximize functional gains made in PT. Target date: 05/23/15   PT SHORT TERM GOAL #2   Title Complete Neck Disability Index to assess pt-perceived disability due to cervical spine symptoms. Target date: 05/23/15   Baseline NDI = 42%, raw = 21 received 05/19/15   PT SHORT TERM GOAL #3   Title Pt will improve active cervical spine lateral flexion to >/= 33 degrees bilaterally to progress toward more normalized ROM. Target date: 05/23/15   Baseline Lateral flexion AROM: 24 degrees to L, 27 degrees to R.   PT SHORT TERM GOAL #4   Title Pt will improve active cervical spine rotation to >/= 67 degrees to increase safety with driving. Target date: 05/23/15   Baseline Rotation AROM: 58 degrees to R, 60 degrees to L   PT SHORT TERM GOAL #5   Title Initiate education on fall prevention strategies (focus on multisensory environment) to decrease fall risk in home environment. Target date: 05/23/15   PT SHORT TERM GOAL #7   Title             PT Long Term Goals - 05/19/15 1539    PT LONG TERM GOAL #1   Title Pt will be independent with home exercise program to maximize functional gains made in PT. Target date: 06/13/15   PT LONG TERM GOAL #2   Title Improve Neck Disability Index by 15% (or 7.5 points) from baseline to indicate significant decrease in pt-perceived disability due to cervical spine symptoms  Target date: 06/13/15   Baseline NDI = 42%, raw = 21 received 05/19/15   PT LONG TERM GOAL #3   Title Pt will improve active cervical spine lateral flexion to >/= 40 degrees bilaterally to indicate improved muscle extensibility/balance. Target date: 06/13/15   Baseline Baseline: 24 degrees to L,  27 degrees to R   PT LONG TERM GOAL #4   Title Pt will improve active cervical spine rotation to >/= 75 degrees to maximize safety with driving.  Target date: 06/13/15   Baseline Baseline: 58 degrees to R, 60 degrees to L   PT LONG TERM GOAL #5   Title Pt will perform Craniocervical Flexion Test for 5 increments without compensation to indicate increased endurance in deep neck flexor muscles. Target date: 06/13/15   PT LONG TERM GOAL #6   Title Pt will decrease DHI score from 52 to </= 34 to indicate significant decrease in pt-perceived disability due to dizziness. Target date: 06/13/15            Plan - 05/26/15 1235    Clinical Impression Statement  Patient with subjective improvements and grossly increased motion following cervical joint mobilizations. Pt displays increased lumbar lordosis during performance of plank leading to excessive thoracic kyphosis, likely increasing strain on cervical paraspinals. Pt performed modified plank with decreased cervical strain, able to demonstrate appropriate spinal awareness/post pelvic tilt with tactile cues. Pt demonstrates improved ability to perform scapular retraction/depression and improved scapular awareness and motor control with UE in closed chain during side plank. Decreased postural control during vestibular exercises today due to pt allergies.    Pt will benefit from skilled therapeutic intervention in order to improve on the following deficits Dizziness;Other (comment);Abnormal gait;Decreased balance;Decreased endurance;Decreased range of motion;Decreased strength;Impaired flexibility;Postural dysfunction;Hypomobility   Clinical Impairments Affecting Rehab Potential active Meniere's Disease   PT Frequency 2x / week   PT Duration 6 weeks   PT Treatment/Interventions ADLs/Self Care Home Management;Moist Heat;Vestibular;Balance training;Therapeutic exercise;Therapeutic activities;Functional mobility training;Stair training;Gait  training;Neuromuscular re-education;Patient/family education;Manual techniques;Passive range of motion   PT Next Visit Plan CHECK GOALS, joint mobs to improve cervical SB, review HEP, progress if appropriate, progress supine chin tuck<>seated chin tuck, vestibular training   PT Home Exercise Plan Modified plank emphasizing post pelvic tilt and spinal alignment, modified side plank with emphasis on scapular awareness and motor control, chin tuck   Consulted and Agree with Plan of Care Patient        Problem List Patient Active Problem List   Diagnosis Date Noted  . Adjustment disorder with anxiety 05/03/2015  . Cochleovestibular active Meniere's disease 05/03/2015  . 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 Insomnia 05/03/2015  . Vitamin D deficiency 05/03/2015  . Deviated nasal septum 05/03/2015    Celene Squibb, SPT 05/26/2015, 12:43 PM  [facility] [address] [place], [place], [address] Phone: [phone]   Fax:  [phone]  Name: Jody Clarke MRN: [id number] Date of Birth: 1960-08-31

## 2015-05-26 NOTE — Patient Instructions (Signed)
Small Ball Side Plank    Lie sideways on bent elbow. Pull the shoulder blade down and back. Hold _5__ seconds. Do until fatigued or until you feel form compromise. FORM IS KEY!  Copyright  VHI. All rights reserved.  DEVELOPMENTAL POSITION: Quadruped Weight Shift    Maintain weight over hands and knees. Relax shoulders. POSTERIOR PELVIC TILT and flatten your mid spine.  Hold 5-10 seconds.   Copyright  VHI. All rights reserved.

## 2015-05-30 ENCOUNTER — Ambulatory Visit: Payer: Managed Care, Other (non HMO) | Admitting: Rehabilitative and Restorative Service Providers"

## 2015-05-30 DIAGNOSIS — H832X2 Labyrinthine dysfunction, left ear: Secondary | ICD-10-CM

## 2015-05-30 DIAGNOSIS — M6281 Muscle weakness (generalized): Secondary | ICD-10-CM

## 2015-05-30 DIAGNOSIS — M436 Torticollis: Secondary | ICD-10-CM

## 2015-05-30 NOTE — Therapy (Signed)
[facility] [address] [place], [place], [address] Phone: [phone]   Fax:  [phone]  Physical Therapy Treatment  Patient Details  Name: Jody Clarke MRN: [id number] Date of Birth: July 31, 1960 Referring Provider: Melida Quitter, MD  Encounter Date: 05/30/2015      PT End of Session - 05/30/15 1452    Visit Number 6   Number of Visits 13   Date for PT Re-Evaluation 06/16/15   Authorization Type [employer]   Authorization - Number of Visits 67   PT Start Time 1237   PT Stop Time 1326   PT Time Calculation (min) 49 min   Equipment Utilized During Treatment --   Activity Tolerance Patient tolerated treatment well   Behavior During Therapy [facility] for tasks assessed/performed      Past Medical History  Diagnosis Date  . Allergy     Allergic rhinitis  . Anxiety     Past Surgical History  Procedure Laterality Date  . 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 Tonsillectomy    . Knee surgery      There were no vitals filed for this visit.  Visit Diagnosis:  Stiffness of neck  Muscular weakness  Vestibular disequilibrium, left      Subjective Assessment - 05/30/15 1241    Subjective Pt reports her neck is doing "a little better than a few weeks ago." Says her allergies are flaring up which is making her feel a little more "swimmy headed." Has been consistent with HEP.   Pertinent History Meniere's Disease, anxiety, depression   Patient Stated Goals "To learn some things to help to relieve some of the neck tension and then to understand which symptoms are related to vestibular impairments, and manage those symptoms."   Pain Score 3    Pain Location Neck   Pain Descriptors / Indicators Aching   Pain Type Chronic pain   Pain Onset More than a month ago   Pain Frequency Constant   Pain Relieving Factors heating pad            [facility] PT Assessment - 05/30/15 0001    AROM   Cervical -  Right Side Bend 21  following manual: 23   Cervical - Left Side Bend 20  following manual: 27   Cervical - Right Rotation 50  following manual: 63   Cervical - Left Rotation 60  following manual: 67            [facility] Adult PT Treatment/Exercise - 05/30/15 0001    Manual Therapy   Manual Therapy Muscle Energy Technique;Joint mobilization;Soft tissue mobilization;Manual Traction   Manual therapy comments Patient is painful to palpation C3 L lateral spinous process.  She has decreased posterior>anterior glide at C3 with pain noted.   Joint Mobilization The following performed with the pt in supine: upglides grade 3 to R cervical vertebrae 2x30 sec each with passive L cervical sidebending and rotation. Upglides grade 1-2 to L cervical vertebrae 2x30 sec each with passive R cervical sidebending and rotation. Pt complains of increased tenderness with upglides facilitating R rot/SB   Soft tissue mobilization In supine position, performed passive stretching into sidebending with passive overpressure and shoulder depression for muscle elongation.   Manual Traction Gentle manual distraction performed with patient in supine position.    Muscle Energy Technique Performed supine mild cervical extension over towel roll adding rotation with head nods to both R and L sides. The following performed in supine:  contract relax technique at end range cervical flexion 8 sec contract with movement further into end range during relax portion to facilitate atlantoaxial rotation.            PT Short Term Goals - 05/30/15 1244    PT SHORT TERM GOAL #1   Title Pt will perform initial home exercise program with mod I using paper handout to maximize functional gains made in PT. Target date: 05/23/15   Status Achieved   PT SHORT TERM GOAL #2   Title Complete Neck Disability Index to assess pt-perceived disability due to cervical spine symptoms. Target date: 05/23/15   Baseline NDI = 42%, raw = 21 received 05/19/15    Status Deferred   PT SHORT TERM GOAL #3   Title Pt will improve active cervical spine lateral flexion to >/= 33 degrees bilaterally to progress toward more normalized ROM. Target date: 05/23/15   Baseline Lateral flexion AROM: 20 degrees to L, 21 degrees to R.   Status On-going   PT SHORT TERM GOAL #4   Title Pt will improve active cervical spine rotation to >/= 67 degrees to increase safety with driving. Target date: 05/23/15   Baseline Rotation AROM: 50 degrees to R, 60 degrees to L   Status On-going   PT SHORT TERM GOAL #5   Title Initiate education on fall prevention strategies (focus on multisensory environment) to decrease fall risk in home environment. Target date: 05/23/15   Status Deferred   PT SHORT TERM GOAL #7   Title             PT Long Term Goals - 05/19/15 1539    PT LONG TERM GOAL #1   Title Pt will be independent with home exercise program to maximize functional gains made in PT. Target date: 06/13/15   PT LONG TERM GOAL #2   Title Improve Neck Disability Index by 15% (or 7.5 points) from baseline to indicate significant decrease in pt-perceived disability due to cervical spine symptoms  Target date: 06/13/15   Baseline NDI = 42%, raw = 21 received 05/19/15   PT LONG TERM GOAL #3   Title Pt will improve active cervical spine lateral flexion to >/= 40 degrees bilaterally to indicate improved muscle extensibility/balance. Target date: 06/13/15   Baseline Baseline: 24 degrees to L, 27 degrees to R   PT LONG TERM GOAL #4   Title Pt will improve active cervical spine rotation to >/= 75 degrees to maximize safety with driving.  Target date: 06/13/15   Baseline Baseline: 58 degrees to R, 60 degrees to L   PT LONG TERM GOAL #5   Title Pt will perform Craniocervical Flexion Test for 5 increments without compensation to indicate increased endurance in deep neck flexor muscles. Target date: 06/13/15   PT LONG TERM GOAL #6   Title Pt will decrease DHI score from 52 to </= 34 to indicate  significant decrease in pt-perceived disability due to dizziness. Target date: 06/13/15            Plan - 05/30/15 1443    Clinical Impression Statement Patient has met 1/5 STGs, achieved independence with HEP. Decrease in cervical ROM may be due to interrater reliability limitations. Pt with improved neck pain today, 3/10. Cervical AROM improved during today's session following manual therapy, METs for AA rotation. Hypomobility and block felt with PA testing C3-C4, likely contributing to pts symptoms. Continue per POC.   Pt will benefit from skilled therapeutic intervention in order to improve  on the following deficits Dizziness;Other (comment);Abnormal gait;Decreased balance;Decreased endurance;Decreased range of motion;Decreased strength;Impaired flexibility;Postural dysfunction;Hypomobility   Clinical Impairments Affecting Rehab Potential active Meniere's Disease   PT Frequency 2x / week   PT Duration 6 weeks   PT Treatment/Interventions ADLs/Self Care Home Management;Moist Heat;Vestibular;Balance training;Therapeutic exercise;Therapeutic activities;Functional mobility training;Stair training;Gait training;Neuromuscular re-education;Patient/family education;Manual techniques;Passive range of motion   PT Next Visit Plan *patient requested to remain with one PT vs. switch between 2 therapists.  Position prone to further assess cervical posterior>anterior joint mob and thoracic extension.   Consulted and Agree with Plan of Care Patient        Problem List Patient Active Problem List   Diagnosis Date Noted  . Adjustment disorder with anxiety 05/03/2015  . Cochleovestibular active Meniere's disease 05/03/2015  . 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 Insomnia 05/03/2015  . Vitamin D deficiency 05/03/2015  . Deviated nasal septum 05/03/2015  WEAVER,CHRISTINA, PT Co-treated by PT and SPT  De Nurse, SPT 05/30/2015, 3:05 PM  [facility] [address] [place], [place], [address] Phone: [phone]   Fax:  [phone]  Name: Jody Clarke MRN: [id number] Date of Birth: July 18, 1960

## 2015-06-01 ENCOUNTER — Ambulatory Visit: Payer: Managed Care, Other (non HMO) | Admitting: Rehabilitative and Restorative Service Providers"

## 2015-06-02 ENCOUNTER — Ambulatory Visit: Payer: Managed Care, Other (non HMO) | Admitting: Physical Therapy

## 2015-06-07 ENCOUNTER — Ambulatory Visit: Payer: Managed Care, Other (non HMO) | Admitting: Physical Therapy

## 2015-06-08 ENCOUNTER — Ambulatory Visit: Payer: Managed Care, Other (non HMO) | Admitting: Rehabilitative and Restorative Service Providers"

## 2015-06-08 DIAGNOSIS — M436 Torticollis: Secondary | ICD-10-CM | POA: Diagnosis not present

## 2015-06-08 DIAGNOSIS — M6281 Muscle weakness (generalized): Secondary | ICD-10-CM

## 2015-06-08 NOTE — Therapy (Signed)
[facility] [address] [place], [place], [address] Phone: [phone]   Fax:  [phone]  Physical Therapy Treatment  Patient Details  Name: Jody Clarke MRN: [id number] Date of Birth: 1960-11-23 Referring Provider: Christia Reading, MD  Encounter Date: 06/08/2015      PT End of Session - 06/08/15 1249    Visit Number 7   Number of Visits 13   Date for PT Re-Evaluation 06/16/15   Authorization Type [employer]   Authorization - Visit Number 1   Authorization - Number of Visits 60   PT Start Time 1021  Pt arrived late   PT Stop Time 1103   PT Time Calculation (min) 42 min   Activity Tolerance Patient tolerated treatment well   Behavior During Therapy [facility] for tasks assessed/performed      Past Medical History  Diagnosis Date  . Allergy     Allergic rhinitis  . Anxiety     Past Surgical History  Procedure Laterality Date  . 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 Tonsillectomy    . Knee surgery      There were no vitals filed for this visit.  Visit Diagnosis:  Stiffness of neck  Muscular weakness      Subjective Assessment - 06/08/15 1025    Subjective Pt reports she had "a little flare up" with her neck following last session. She said approximately 2-3 days later it felt "inflamed" and she was "in pain" and "had to do Advil every 4 hours." She has been doing her HEP, tried seated chin tucks and supine combined cervical rotation/extension and is wondering if this contributed to her pain. Says she may get "flare ups" every 4-5 months.   Pertinent History Meniere's Disease, anxiety, depression   Patient Stated Goals "To learn some things to help to relieve some of the neck tension and then to understand which symptoms are related to vestibular impairments, and manage those symptoms."   Currently in Pain? Yes   Pain Score 6    Pain Location Neck   Pain Orientation Left   Pain Descriptors  / Indicators Aching   Pain Type Chronic pain   Pain Onset More than a month ago   Aggravating Factors  unsure - see subjective   Pain Relieving Factors Advil   Effect of Pain on Daily Activities limited ADLs, walking           [facility] Adult PT Treatment/Exercise - 06/08/15 1241    Exercises   Exercises Other Exercises   Other Exercises  Pt performed self massage/soft tissue mobilization supine in hooklying using tennis ball x 2 min to L periscapular muscules. Pt felt she was unable to maneuver tennis ball appropriately. pt performed self massage/soft tissue mobilization to L periscapular muscles in standing next to wall x 4 min. Pt performed prone thoracic extension with scapular retraction/depression x12 reps. Verbal cues for pt to facilitate movement from mid-upper thoracic spine segments, perform submaximal chin tuck for proper spinal aligment.   Manual Therapy   Manual Therapy Soft tissue mobilization;Joint mobilization   Joint Mobilization Performed grade I-II joint mobilizations with PT positioned lateral to spinous process to facilitate R/L rotation with pt positioned in prone at C2 and C3 x3 min each segment for pain relief. Hypomobility and firm end feel noted C2/C3 R/L rotation. Performed PA thoracic mobilization with patient prone at mid-upper thoracic x6 min. Decreased mobility noted T1-T4.   Soft tissue mobilization Soft tissue mobilization  to L periscapular muscles x5 min             PT Education - 06/08/15 1248    Education provided Yes   Education Details Pt instructed to perform self mobilization utilizing tennis ball to L periscapular musculature for pain relief. Instructed to perform thoracic extension in prone to facilitate increased movement at upper thoracic spine. Pt instructed to bring foam roller to next PT session for instruction on proper use for thoracic mobilization.   Person(s) Educated Patient   Methods Explanation;Demonstration   Comprehension Verbalized  understanding;Returned demonstration          PT Short Term Goals - 05/30/15 1244    PT SHORT TERM GOAL #1   Title Pt will perform initial home exercise program with mod I using paper handout to maximize functional gains made in PT. Target date: 05/23/15   Status Achieved   PT SHORT TERM GOAL #2   Title Complete Neck Disability Index to assess pt-perceived disability due to cervical spine symptoms. Target date: 05/23/15   Baseline NDI = 42%, raw = 21 received 05/19/15   Status Deferred   PT SHORT TERM GOAL #3   Title Pt will improve active cervical spine lateral flexion to >/= 33 degrees bilaterally to progress toward more normalized ROM. Target date: 05/23/15   Baseline Lateral flexion AROM: 20 degrees to L, 21 degrees to R.   Status On-going   PT SHORT TERM GOAL #4   Title Pt will improve active cervical spine rotation to >/= 67 degrees to increase safety with driving. Target date: 05/23/15   Baseline Rotation AROM: 50 degrees to R, 60 degrees to L   Status On-going   PT SHORT TERM GOAL #5   Title Initiate education on fall prevention strategies (focus on multisensory environment) to decrease fall risk in home environment. Target date: 05/23/15   Status Deferred   PT SHORT TERM GOAL #7   Title             PT Long Term Goals - 05/19/15 1539    PT LONG TERM GOAL #1   Title Pt will be independent with home exercise program to maximize functional gains made in PT. Target date: 06/13/15   PT LONG TERM GOAL #2   Title Improve Neck Disability Index by 15% (or 7.5 points) from baseline to indicate significant decrease in pt-perceived disability due to cervical spine symptoms  Target date: 06/13/15   Baseline NDI = 42%, raw = 21 received 05/19/15   PT LONG TERM GOAL #3   Title Pt will improve active cervical spine lateral flexion to >/= 40 degrees bilaterally to indicate improved muscle extensibility/balance. Target date: 06/13/15   Baseline Baseline: 24 degrees to L, 27 degrees to R   PT LONG TERM  GOAL #4   Title Pt will improve active cervical spine rotation to >/= 75 degrees to maximize safety with driving.  Target date: 06/13/15   Baseline Baseline: 58 degrees to R, 60 degrees to L   PT LONG TERM GOAL #5   Title Pt will perform Craniocervical Flexion Test for 5 increments without compensation to indicate increased endurance in deep neck flexor muscles. Target date: 06/13/15   PT LONG TERM GOAL #6   Title Pt will decrease DHI score from 52 to </= 34 to indicate significant decrease in pt-perceived disability due to dizziness. Target date: 06/13/15            Plan - 06/08/15 1250    Clinical  Impression Statement Pt with increased cervical pain and L periscapular pain during today's session. Joint mobilizations and soft tissue massage utilized to decrease pt pain and promote relaxation with positive results. Hypomobility with PA spring testing noted at mid-upper thoracic spine. Pt would benefit from PA thoracic mobilizations.   Pt will benefit from skilled therapeutic intervention in order to improve on the following deficits Dizziness;Other (comment);Abnormal gait;Decreased balance;Decreased endurance;Decreased range of motion;Decreased strength;Impaired flexibility;Postural dysfunction;Hypomobility   Rehab Potential Good   Clinical Impairments Affecting Rehab Potential active Meniere's Disease   PT Frequency 2x / week   PT Duration 6 weeks   PT Treatment/Interventions ADLs/Self Care Home Management;Moist Heat;Vestibular;Balance training;Therapeutic exercise;Therapeutic activities;Functional mobility training;Stair training;Gait training;Neuromuscular re-education;Patient/family education;Manual techniques;Passive range of motion   PT Next Visit Plan Check LTGs, review HEP, PA thoracic mobilizations for extension   PT Home Exercise Plan prone mid-upper thoracic extension, self massage with tennis ball in standing against wall   Consulted and Agree with Plan of Care Patient         Problem List Patient Active Problem List   Diagnosis Date Noted  . Adjustment disorder with anxiety 05/03/2015  . Cochleovestibular active Meniere's disease 05/03/2015  . 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 Insomnia 05/03/2015  . Vitamin D deficiency 05/03/2015  . Deviated nasal septum 05/03/2015    Celene Squibb, SPT 06/08/2015, 12:59 PM  [facility] [address] [place], [place], [address] Phone: [phone]   Fax:  [phone]  Name: Roslynn Holte MRN: [id number] Date of Birth: 1960-06-30

## 2015-06-09 ENCOUNTER — Ambulatory Visit: Payer: Managed Care, Other (non HMO) | Admitting: Physical Therapy

## 2015-06-10 ENCOUNTER — Ambulatory Visit: Payer: Managed Care, Other (non HMO) | Admitting: Rehabilitative and Restorative Service Providers"

## 2015-06-10 DIAGNOSIS — M436 Torticollis: Secondary | ICD-10-CM

## 2015-06-10 DIAGNOSIS — M6281 Muscle weakness (generalized): Secondary | ICD-10-CM

## 2015-06-10 NOTE — Patient Instructions (Signed)
All Fours Shoulder Press-Up    Move from rounded back position to a flat back position. Think about keeping belly button towards spine, focusing on moving from thoracic spine (upper back) http://cc.exer.us/61   Copyright  VHI. All rights reserved.  Thoracic Self-Mobilization (Supine)    With foam roller placed lengthwise, keep low back pressed down into foam roller.  Stretch arms out to the side.  Hold for 1 minute. Relax. Repeat __1-2__ times per day.  http://orth.exer.us/1001   Copyright  VHI. All rights reserved.

## 2015-06-10 NOTE — Therapy (Signed)
[facility] [address] [place], [place], [address] Phone: [phone]   Fax:  [phone]  Physical Therapy Treatment  Patient Details  Name: Jody Clarke MRN: [id number] Date of Birth: Dec 14, 1960 Referring Provider: Christia Reading, MD  Encounter Date: 06/10/2015      PT End of Session - 06/10/15 1613    Visit Number 8   Number of Visits 13   Date for PT Re-Evaluation 06/16/15   Authorization Type [employer]   Authorization - Visit Number 1   Authorization - Number of Visits 60   PT Start Time 1319   PT Stop Time 1404   PT Time Calculation (min) 45 min   Equipment Utilized During Treatment Gait belt   Activity Tolerance Patient tolerated treatment well   Behavior During Therapy [facility] for tasks assessed/performed      Past Medical History  Diagnosis Date  . Allergy     Allergic rhinitis  . Anxiety     Past Surgical History  Procedure Laterality Date  . 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 Tonsillectomy    . Knee surgery      There were no vitals filed for this visit.  Visit Diagnosis:  Stiffness of neck  Muscular weakness      Subjective Assessment - 06/10/15 1550    Subjective Pt reports decreased pain in neck since last session. Continues to complain of pain between shoulder blades, greater on L. Says rolling on tennis ball helped.   Pertinent History Meniere's Disease, anxiety, depression   Patient Stated Goals "To learn some things to help to relieve some of the neck tension and then to understand which symptoms are related to vestibular impairments, and manage those symptoms."   Currently in Pain? Yes   Pain Score 4    Pain Location Neck   Pain Orientation Left   Pain Descriptors / Indicators Aching   Pain Type Chronic pain   Pain Radiating Towards L shoulder blade   Pain Onset More than a month ago   Pain Frequency Constant   Aggravating Factors  unsure   Pain  Relieving Factors Advil, heating pad, tennis ball   Effect of Pain on Daily Activities just hurts           [facility] PT Assessment - 06/10/15 1557    ROM / Strength   AROM / PROM / Strength AROM   AROM   Overall AROM  Deficits   AROM Assessment Site Cervical   Cervical - Right Side Bend 14   Cervical - Left Side Bend 14   Cervical - Right Rotation 49   Cervical - Left Rotation 58          [facility] Adult PT Treatment/Exercise - 06/10/15 1355    Self-Care   Self-Care Other Self-Care Comments   Other Self-Care Comments  Instructed patient on self mobilization technique for thoracic extension using personal foam roller. Instructed pt on using foam roller perpindicular with spine and performing horizontal shoulder abduction to facilitate stretching of anterior shoulder muscles. Pillow placed under head to decrease strain on neck and verbal cues to maintain post pelvic tilt.   Neuro Re-ed    Neuro Re-ed Details  Pt performed thoracic extension in prone x3. Noted inc extension at thoracolumbar junction, exercise ceased  due to this. Pt instructed to perform prone thoracic extension with towel/pillow underneath hips with negative results.Pillow placed underneath mid thoracic spine with negative results. Quadruped with verbal, tactile, external  cues for posterior pelvic tilt and increased upper thoracic extension. Visual cue of mirror and picture on pt's cell phone for increased self awareness. External cue of sticky note on chest with verbal cue to push sticky note to mat table elicited improved upper thoracic extension. Pt performed upper thoracic extension while seated with chair back as fulcrum to facilitate increased thoracic extension and verbal cues for increased post pelvic tilt. Pt performed prone thoracic extension with scapular retraction/depression with table acting as fulcrum from approximately T5/6. Verbal cues to maintain posterior pelvic tilt to avoid extension from thoracolumbar junction.     Manual Therapy   Manual Therapy Joint mobilization   Manual therapy comments Pt with hypomobility at upper to mid thoracic spine. Grade IV PA joint mobilizations T1-T6 x8 minutes            PT Education - 06/10/15 1612    Education provided Yes   Education Details HEP for mid to upper thoracic extension in quadruped and foam roll use. Pt educated on use of foam roller and posture with performance of exercises at home. Pt educated on increased extension from thoracolumbar junction and decreased motion in mid to upper thoracic spine and how this may be contributing to cervical pain.   Person(s) Educated Patient   Methods Explanation;Demonstration;Handout   Comprehension Verbalized understanding;Returned demonstration;Verbal cues required;Tactile cues required          PT Short Term Goals - 05/30/15 1244    PT SHORT TERM GOAL #1   Title Pt will perform initial home exercise program with mod I using paper handout to maximize functional gains made in PT. Target date: 05/23/15   Status Achieved   PT SHORT TERM GOAL #2   Title Complete Neck Disability Index to assess pt-perceived disability due to cervical spine symptoms. Target date: 05/23/15   Baseline NDI = 42%, raw = 21 received 05/19/15   Status Deferred   PT SHORT TERM GOAL #3   Title Pt will improve active cervical spine lateral flexion to >/= 33 degrees bilaterally to progress toward more normalized ROM. Target date: 05/23/15   Baseline Lateral flexion AROM: 20 degrees to L, 21 degrees to R.   Status On-going   PT SHORT TERM GOAL #4   Title Pt will improve active cervical spine rotation to >/= 67 degrees to increase safety with driving. Target date: 05/23/15   Baseline Rotation AROM: 50 degrees to R, 60 degrees to L   Status On-going   PT SHORT TERM GOAL #5   Title Initiate education on fall prevention strategies (focus on multisensory environment) to decrease fall risk in home environment. Target date: 05/23/15   Status Deferred    PT SHORT TERM GOAL #7   Title             PT Long Term Goals - 06/10/15 1357    PT LONG TERM GOAL #1   Title Pt will be independent with home exercise program to maximize functional gains made in PT. Target date: 06/13/15   PT LONG TERM GOAL #2   Title Improve Neck Disability Index by 15% (or 7.5 points) from baseline to indicate significant decrease in pt-perceived disability due to cervical spine symptoms  Target date: 06/13/15   Baseline NDI = 42%, raw = 21 received 05/19/15   PT LONG TERM GOAL #3   Title Pt will improve active cervical spine lateral flexion to >/= 40 degrees bilaterally to indicate improved muscle extensibility/balance. Target date: 06/13/15   Baseline Baseline:  24 degrees to L, 27 degrees to R   PT LONG TERM GOAL #4   Title Pt will improve active cervical spine rotation to >/= 75 degrees to maximize safety with driving.  Target date: 06/13/15   Baseline Baseline: 58 degrees to R, 60 degrees to L   PT LONG TERM GOAL #5   Title Pt will perform Craniocervical Flexion Test for 5 increments without compensation to indicate increased endurance in deep neck flexor muscles. Target date: 06/13/15   PT LONG TERM GOAL #6   Title Pt will decrease DHI score from 52 to </= 34 to indicate significant decrease in pt-perceived disability due to dizziness. Target date: 06/13/15            Plan - 06/10/15 1613    Clinical Impression Statement Pt exhibits fulcrum of motion at thoracolumbar junction, inc lordosis at lumbar spine during today's session. Continues with limited ROM in c-spine, particularly sidebending. Today's session focused on improving mid to upper thoracic extension through joint mobilizations and neuro re ed. Pt requires max cueing to achieve motion through mid to upper thoracic spine.   Pt will benefit from skilled therapeutic intervention in order to improve on the following deficits Dizziness;Other (comment);Abnormal gait;Decreased balance;Decreased endurance;Decreased  range of motion;Decreased strength;Impaired flexibility;Postural dysfunction;Hypomobility   Clinical Impairments Affecting Rehab Potential active Meniere's Disease   PT Frequency 2x / week   PT Duration 6 weeks   PT Treatment/Interventions ADLs/Self Care Home Management;Moist Heat;Vestibular;Balance training;Therapeutic exercise;Therapeutic activities;Functional mobility training;Stair training;Gait training;Neuromuscular re-education;Patient/family education;Manual techniques;Passive range of motion   PT Next Visit Plan Check LTGs. PA thoracic mobs. Check HEP. Mid to upper thoracic extension exercises - emphasis on post pelvic tilt to change fulcrum from TL junction. Implement motor learning principles for thoracic extension and good posture with exercises - faded feedback, external cues   PT Home Exercise Plan Please see pt instructions   Consulted and Agree with Plan of Care Patient        Problem List Patient Active Problem List   Diagnosis Date Noted  . Adjustment disorder with anxiety 05/03/2015  . Cochleovestibular active Meniere's disease 05/03/2015  . 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 Insomnia 05/03/2015  . Vitamin D deficiency 05/03/2015  . Deviated nasal septum 05/03/2015    Celene Squibb, SPT 06/10/2015, 4:25 PM  [facility] [address] [place], [place], [address] Phone: [phone]   Fax:  [phone]  Name: Jody Clarke MRN: [id number] Date of Birth: [birth year]

## 2015-06-14 ENCOUNTER — Ambulatory Visit: Payer: Managed Care, Other (non HMO) | Admitting: Rehabilitative and Restorative Service Providers"

## 2015-06-14 DIAGNOSIS — H832X2 Labyrinthine dysfunction, left ear: Secondary | ICD-10-CM

## 2015-06-14 DIAGNOSIS — M436 Torticollis: Secondary | ICD-10-CM

## 2015-06-14 DIAGNOSIS — M6281 Muscle weakness (generalized): Secondary | ICD-10-CM

## 2015-06-14 NOTE — Patient Instructions (Signed)
FOR HOME: 1) On hands and knees emphasizing shoulder blades back and down.  2) Standing with hands on countertop (leaning forward) placing posture in plank position. Slowly lift right knee 5 times and then repeat on the left 5 times. Focus on keeping your posture in correct alignment (avoid arching at lower back).  3)  Continue other exercises focusing on keeping your posture in correct alignment.

## 2015-06-14 NOTE — Therapy (Signed)
[facility] [address] [place], [place], [address] Phone: [phone]   Fax:  [phone]  Physical Therapy Treatment  Patient Details  Name: Jody Clarke MRN: [id number] Date of Birth: Dec 05, 1960 Referring Provider: Melida Quitter, MD  Encounter Date: 06/14/2015      PT End of Session - 06/14/15 1305    Visit Number 9   Number of Visits 13   Date for PT Re-Evaluation 06/16/15   Authorization Type [employer]   Authorization - Visit Number 1   Authorization - Number of Visits 54   PT Start Time 1150  Pt arrived late   PT Stop Time 1237   PT Time Calculation (min) 47 min   Activity Tolerance Patient tolerated treatment well   Behavior During Therapy [facility] for tasks assessed/performed      Past Medical History  Diagnosis Date  . Allergy     Allergic rhinitis  . Anxiety     Past Surgical History  Procedure Laterality Date  . 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 Tonsillectomy    . Knee surgery      There were no vitals filed for this visit.  Visit Diagnosis:  Stiffness of neck  Muscular weakness  Vestibular disequilibrium, left      Subjective Assessment - 06/14/15 1152    Subjective Pt arrives today with no significant changes since last session. Says she doesn't feel "perky" today. Says she did a lot around the house and feels fatigued today, had inc pain in L thoracic area following this.   Pertinent History Meniere's Disease, anxiety, depression   Patient Stated Goals "To learn some things to help to relieve some of the neck tension and then to understand which symptoms are related to vestibular impairments, and manage those symptoms."   Currently in Pain? Yes   Pain Score 3    Pain Location Neck   Pain Orientation Left   Pain Descriptors / Indicators Aching   Pain Type Chronic pain   Pain Onset More than a month ago   Pain Frequency Constant   Aggravating Factors  unsure    Pain Relieving Factors Advil   Effect of Pain on Daily Activities just hurts           [facility] PT Assessment - 06/14/15 1244    ROM / Strength   AROM / PROM / Strength Strength   AROM   Overall AROM  Deficits   AROM Assessment Site Cervical   Cervical - Right Side Bend 22   Cervical - Left Side Bend 21   Cervical - Right Rotation 63   Cervical - Left Rotation 69   Strength   Overall Strength Comments Craniocervical flexion test performed during today's session. Starting at 27mHg, pt able to maintain 2 mmHg increase x10 sec hold up to 30 mmHg with 10 second rest between trials.           [facility] PT Treatment/Exercise - 06/14/15 0001    Neuro Re-ed    Neuro Re-ed Details  Pt performed quadruped 3x20 sec with verbal cues for post pelvic tilt and inc thoracic extension. Pt performed active upper thoracic extension in quadruped with emphasis on maintaining post pelvic tilt x8. Pt performed knee to chest while standing at countertop in modified push up position with verbal and tactile cues to maintain post pelvic tilt and inc thoracic extension. Hip flexion used to create active posterior pelvic tilt. Performed prone thoracic extension with  scapular retraction/depression with verbal and tactile cues to "draw belly button to spine" and maintain post pelvic tilt to prevent excessive thoracolumbar extension.   Manual Therapy   Manual Therapy Joint mobilization   Joint Mobilization Grade IV PA thoracic joint mobilizations T2-T6 x6 min, PA mob for R/L thoracic rotation x2 min each to facilitate inc mid-upper thoracic extension and mobility.          PT Education - 06/14/15 1304    Education provided Yes   Education Details HEP modified - please see pt instructions. Pt instructed to perform HEP next t omirror for visual feedback as able in order to achieve optimal motor learning.   Person(s) Educated Patient   Methods Explanation;Demonstration;Handout   Comprehension Verbalized  understanding;Returned demonstration;Verbal cues required;Tactile cues required          PT Short Term Goals - 05/30/15 1244    PT SHORT TERM GOAL #1   Title Pt will perform initial home exercise program with mod I using paper handout to maximize functional gains made in PT. Target date: 05/23/15   Status Achieved   PT SHORT TERM GOAL #2   Title Complete Neck Disability Index to assess pt-perceived disability due to cervical spine symptoms. Target date: 05/23/15   Baseline NDI = 42%, raw = 21 received 05/19/15   Status Deferred   PT SHORT TERM GOAL #3   Title Pt will improve active cervical spine lateral flexion to >/= 33 degrees bilaterally to progress toward more normalized ROM. Target date: 05/23/15   Baseline Lateral flexion AROM: 20 degrees to L, 21 degrees to R.   Status On-going   PT SHORT TERM GOAL #4   Title Pt will improve active cervical spine rotation to >/= 67 degrees to increase safety with driving. Target date: 05/23/15   Baseline Rotation AROM: 50 degrees to R, 60 degrees to L   Status On-going   PT SHORT TERM GOAL #5   Title Initiate education on fall prevention strategies (focus on multisensory environment) to decrease fall risk in home environment. Target date: 05/23/15   Status Deferred   PT SHORT TERM GOAL #7   Title             PT Long Term Goals - 06/14/15 1157    PT LONG TERM GOAL #1   Title Pt will be independent with home exercise program to maximize functional gains made in PT. Target date: 06/13/15   Time 8   Period Weeks   Status Achieved   PT LONG TERM GOAL #2   Title Improve Neck Disability Index by 15% (or 7.5 points) from baseline to indicate significant decrease in pt-perceived disability due to cervical spine symptoms  TARGET DATE = 07/12/15   Baseline NDI = 42%, raw = 21 received 05/19/15   Status On-going   PT LONG TERM GOAL #3   Title Pt will improve active cervical spine lateral flexion to >/= 40 degrees bilaterally to indicate improved muscle  extensibility/balance. Target date: 06/13/15   Baseline Pt with cervical lateral flexion to R 22 degrees, L 21 degrees   Time 8   Period Weeks   Status Not Met   PT LONG TERM GOAL #4   Title Pt will improve active cervical spine rotation to >/= 75 degrees to maximize safety with driving.  Target date: 06/13/15   Baseline Pt with cervical rotation R = 63 degrees, L = 69 degrees   Time 8   Period Weeks   Status Not Met  PT LONG TERM GOAL #5   Title Pt will perform Craniocervical Flexion Test for 5 increments without compensation to indicate increased endurance in deep neck flexor muscles. Target date: 06/13/15   Baseline Achieved 06/14/15   Time 8   Period Weeks   Status Achieved   PT LONG TERM GOAL #6   Title Pt will decrease DHI score from 52 to </= 34 to indicate significant decrease in pt-perceived disability due to dizziness. TARGET DATE = 07/12/15   Baseline DHI = 52   Time 4   Period Weeks   Status On-going   PT LONG TERM GOAL #7   Title The patient will demonstrate ability to find appropriate posture or self-correct her posture while performing at least 2 exercises during PT session without visual feedback and with <2 cues from PT to demonstrate improved postural awareness.   Baseline TARGET DATE = 07/12/15   Time 4   Period Weeks   Status New            Plan - 06/14/15 1310    Clinical Impression Statement The patient demonstrates improvements in deep neck flexor motor control/endurance as she is able to complete the craniocervical flexion test without compensation, however, the patient has continued pain in the cervical and L mid thoracic spine and continues to demonstrate decreased cervical ROM. The patient presents with hypomobility at the mid-upper thoracic spine and hypomobility at the thoracolumbar junction, and impaired motor control of the thoracic extensors likely contributing to her cervical and thoracic pain. Skilled PT is warranted at this time to improve the  patient's motor control patterns to improve neck pain and quality of life.   Pt will benefit from skilled therapeutic intervention in order to improve on the following deficits Dizziness;Other (comment);Abnormal gait;Decreased balance;Decreased endurance;Decreased range of motion;Decreased strength;Impaired flexibility;Postural dysfunction;Hypomobility   Rehab Potential Good   Clinical Impairments Affecting Rehab Potential active Meniere's Disease   PT Frequency 1x / week   PT Duration 4 weeks   PT Treatment/Interventions ADLs/Self Care Home Management;Moist Heat;Vestibular;Balance training;Therapeutic exercise;Therapeutic activities;Functional mobility training;Stair training;Gait training;Neuromuscular re-education;Patient/family education;Manual techniques;Passive range of motion   PT Next Visit Plan Thoracic ext exercises - quadruped, countertop with hip flexion. Implement motor control principles for self learning and automaticity of movement. self mob with foam roller?, manual for cervical   PT Home Exercise Plan Please see pt instructions   Consulted and Agree with Plan of Care Patient       Problem List Patient Active Problem List   Diagnosis Date Noted  . Adjustment disorder with anxiety 05/03/2015  . Cochleovestibular active Meniere's disease 05/03/2015  . 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 Insomnia 05/03/2015  . Vitamin D deficiency 05/03/2015  . Deviated nasal septum 05/03/2015    De Nurse, SPT  06/14/2015, 4:14 PM  [facility] [address] [place], [place], [address] Phone: [phone]   Fax:  [phone]  Name: Ann Bohne MRN: [id number] Date of Birth: Jun 03, 1960

## 2015-06-15 NOTE — Addendum Note (Signed)
Addended by: Margretta Ditty M on: 06/15/2015 08:37 AM   Modules accepted: Orders

## 2015-06-16 ENCOUNTER — Ambulatory Visit: Payer: Managed Care, Other (non HMO) | Admitting: Rehabilitative and Restorative Service Providers"

## 2015-06-22 ENCOUNTER — Ambulatory Visit
Payer: Managed Care, Other (non HMO) | Attending: Otolaryngology | Admitting: Rehabilitative and Restorative Service Providers"

## 2015-06-22 DIAGNOSIS — M6281 Muscle weakness (generalized): Secondary | ICD-10-CM

## 2015-06-22 DIAGNOSIS — M436 Torticollis: Secondary | ICD-10-CM | POA: Diagnosis not present

## 2015-06-22 DIAGNOSIS — H832X2 Labyrinthine dysfunction, left ear: Secondary | ICD-10-CM | POA: Insufficient documentation

## 2015-06-22 NOTE — Patient Instructions (Signed)
Scapular Retraction: Abduction / Extension (Prone)    Lie with arms out from sides 90. Pinch shoulder blades together and raise arms a few inches from floor. Do one side at a time. Repeat _10___ times per set. Do __2__ sets per session. Do __2__ sessions per day.  http://orth.exer.us/959   Copyright  VHI. All rights reserved.  Scapular Retraction: Flexion (Prone)    Lie with arms forward. Pinch shoulder blades together and raise arms a few inches from floor. Repeat 10_ times per set. Do __2__ sets per session. Do _2___ sessions per day.  http://orth.exer.us/961   Copyright  VHI. All rights reserved.  Depression (Active)    Start with erect posture. Lower shoulders. Hold _5___ seconds. Repeat 10____ times. Do _2___ sessions per day.  Copyright  VHI. All rights reserved.    Continue chin tucks lying on your back.

## 2015-06-22 NOTE — Therapy (Signed)
[facility] [address] [place], [place], [address] Phone: [phone]   Fax:  [phone]  Physical Therapy Treatment  Patient Details  Name: Jody Clarke MRN: [id number] Date of Birth: July 13, 1960 Referring Provider: Melida Quitter, MD  Encounter Date: 06/22/2015      PT End of Session - 06/22/15 1242    Visit Number 10   Number of Visits 13   Date for PT Re-Evaluation 06/16/15   Authorization Type [employer]   Authorization - Visit Number 1   Authorization - Number of Visits 60   PT Start Time [phone]   PT Stop Time 0930   PT Time Calculation (min) 44 min   Activity Tolerance Patient tolerated treatment well   Behavior During Therapy [facility] for tasks assessed/performed      Past Medical History  Diagnosis Date  . Allergy     Allergic rhinitis  . Anxiety     Past Surgical History  Procedure Laterality Date  . 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 Tonsillectomy    . Knee surgery      There were no vitals filed for this visit.  Visit Diagnosis:  Stiffness of neck  Muscular weakness      Subjective Assessment - 06/22/15 2141    Subjective The patient reports that she feels improvement in neck ROM and overall mobility in mid back noting less trigger points.  She had a drop attack last week while in a store and had difficulty ambulating to car. She feels she is more aware of her posture and using correct muscles during exercise.    Pertinent History Meniere's Disease, anxiety, depression   Patient Stated Goals "To learn some things to help to relieve some of the neck tension and then to understand which symptoms are related to vestibular impairments, and manage those symptoms."   Currently in Pain? Yes   Pain Score --  Notes improvement overall with times of soreness in L parascapular musculature   Pain Location Scapula   Pain Orientation Left   Pain Descriptors / Indicators Aching   Pain  Type Chronic pain   Pain Onset More than a month ago   Pain Frequency Intermittent   Aggravating Factors  unsure   Pain Relieving Factors Advil          [facility] Adult PT Treatment/Exercise - 06/22/15 2134    Self-Care   Self-Care Other Self-Care Comments   Other Self-Care Comments  Recommended patient provide PT with list of home exercises so we can modify to ensure correct muscle learning during repetitive strengthening tasks in her home environment.    Exercises   Exercises Other Exercises   Other Exercises  Performed prone scapular retraction with shoulder flexion lifting off of mat, prone scapular retraction with shoulders positioned at 90 degrees abduction x approximately 8 reps R/L sides with tactile cues.  Performed prone on elbows with improved awareness of scapular positioning demonstrating muscle learning as compared to earlier treatment sessions.  Supine self mobilization with thoracic towel roll stretch for anterior chest stretching x 2 minutes.  Also performed over physioball for anterior chest stretch with cues on core stabilization and abdominal contraction. Standing shoulder depression using visual cues for improved technique.  Attempted with manual resistance and then tried with theraband with L scapular winging noted.  Provided without resistance for HEP.  Updated HEP to include today's exercises + chin tucks in supine.    Manual Therapy   Manual Therapy  Joint mobilization   Manual therapy comments Patient with hypomobility noted at mid thoracic region.  She has tenderness to palpation of muscles lateral to mid thoracic spinous processes.   Joint Mobilization Grade III-IV posterior to anterior mid thoracic mobilization with patient positioned in prone.                PT Education - 06/22/15 1241    Education provided Yes   Education Details HEP: scapular retraction abduction prone, scapular retraction shoulder flexion prone, shoulder depression   Person(s) Educated  Patient   Methods Demonstration;Handout;Explanation   Comprehension Verbalized understanding;Returned demonstration;Need further instruction          PT Short Term Goals - 05/30/15 1244    PT SHORT TERM GOAL #1   Title Pt will perform initial home exercise program with mod I using paper handout to maximize functional gains made in PT. Target date: 05/23/15   Status Achieved   PT SHORT TERM GOAL #2   Title Complete Neck Disability Index to assess pt-perceived disability due to cervical spine symptoms. Target date: 05/23/15   Baseline NDI = 42%, raw = 21 received 05/19/15   Status Deferred   PT SHORT TERM GOAL #3   Title Pt will improve active cervical spine lateral flexion to >/= 33 degrees bilaterally to progress toward more normalized ROM. Target date: 05/23/15   Baseline Lateral flexion AROM: 20 degrees to L, 21 degrees to R.   Status On-going   PT SHORT TERM GOAL #4   Title Pt will improve active cervical spine rotation to >/= 67 degrees to increase safety with driving. Target date: 05/23/15   Baseline Rotation AROM: 50 degrees to R, 60 degrees to L   Status On-going   PT SHORT TERM GOAL #5   Title Initiate education on fall prevention strategies (focus on multisensory environment) to decrease fall risk in home environment. Target date: 05/23/15   Status Deferred   PT SHORT TERM GOAL #7   Title             PT Long Term Goals - 06/14/15 1157    PT LONG TERM GOAL #1   Title Pt will be independent with home exercise program to maximize functional gains made in PT. Target date: 06/13/15   Time 8   Period Weeks   Status Achieved   PT LONG TERM GOAL #2   Title Improve Neck Disability Index by 15% (or 7.5 points) from baseline to indicate significant decrease in pt-perceived disability due to cervical spine symptoms  TARGET DATE = 07/12/15   Baseline NDI = 42%, raw = 21 received 05/19/15   Status On-going   PT LONG TERM GOAL #3   Title Pt will improve active cervical spine lateral flexion  to >/= 40 degrees bilaterally to indicate improved muscle extensibility/balance. Target date: 06/13/15   Baseline Pt with cervical lateral flexion to R 22 degrees, L 21 degrees   Time 8   Period Weeks   Status Not Met   PT LONG TERM GOAL #4   Title Pt will improve active cervical spine rotation to >/= 75 degrees to maximize safety with driving.  Target date: 06/13/15   Baseline Pt with cervical rotation R = 63 degrees, L = 69 degrees   Time 8   Period Weeks   Status Not Met   PT LONG TERM GOAL #5   Title Pt will perform Craniocervical Flexion Test for 5 increments without compensation to indicate increased endurance in deep neck  flexor muscles. Target date: 06/13/15   Baseline Achieved 06/14/15   Time 8   Period Weeks   Status Achieved   PT LONG TERM GOAL #6   Title Pt will decrease DHI score from 52 to </= 34 to indicate significant decrease in pt-perceived disability due to dizziness. TARGET DATE = 07/12/15   Baseline DHI = 52   Time 4   Period Weeks   Status On-going   PT LONG TERM GOAL #7   Title The patient will demonstrate ability to find appropriate posture or self-correct her posture while performing at least 2 exercises during PT session without visual feedback and with <2 cues from PT to demonstrate improved postural awareness.   Baseline TARGET DATE = 07/12/15   Time 4   Period Weeks   Status New               Plan - 06/22/15 1243    Clinical Impression Statement The patient is demonstrating progress with scapular retraction and is able to isolate parascapular muscles separate from lower thoracic mobility.  PT is progressing HEP as patient is progressing with isolated motor control.  PT is working on Medical illustrator, patient self awareness of posture during exercises and strengthening into scapular retraction.  Continue working towards [employer].    PT Next Visit Plan Thoracic ext exercises - quadruped, countertop with hip flexion. Implement motor control  principles for self learning and automaticity of movement. self mob with foam roller?, manual for cervical   Consulted and Agree with Plan of Care Patient        Problem List Patient Active Problem List   Diagnosis Date Noted  . Adjustment disorder with anxiety 05/03/2015  . Cochleovestibular active Meniere's disease 05/03/2015  . 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 Insomnia 05/03/2015  . Vitamin D deficiency 05/03/2015  . Deviated nasal septum 05/03/2015    Lakyra Tippins, PT 06/22/2015, 9:48 PM  [facility] [address] [place] [address] [place], [place], [address] Phone: [phone]   Fax:  [phone]  Name: Jody Clarke MRN: [id number] Date of Birth: 1960/07/24

## 2015-06-27 ENCOUNTER — Ambulatory Visit: Payer: Managed Care, Other (non HMO) | Admitting: Rehabilitative and Restorative Service Providers"

## 2015-06-27 DIAGNOSIS — M436 Torticollis: Secondary | ICD-10-CM | POA: Diagnosis not present

## 2015-06-27 DIAGNOSIS — M6281 Muscle weakness (generalized): Secondary | ICD-10-CM

## 2015-06-27 DIAGNOSIS — H832X2 Labyrinthine dysfunction, left ear: Secondary | ICD-10-CM

## 2015-06-27 NOTE — Patient Instructions (Signed)
Cardio- 30 min treadmill  Warm-up: Shoulder circles Shoulder shrugs Basic upper body movement no resistance  Back retraction resistance band (set shoulder blades first) Lateral raise SHOULDER: Scaption (Band)    Place arm at 45 angle to body.  Can use 3 lb weight or resistance band while standing.  Perform 15 times.  Copyright  VHI. All rights reserved.   Lat pull down with resistance band Seated row (work on leading back with posterior aspect of your shoulder) One arm bent over row (5 lb) on right side L arm bent row *keep elbow straight and use 3 lb weight to being (or resistance band)  Bicep curl Tricep extension *set shoulder back and down before beginning and use 5 lb weights Chest press Fly  PT HEP:  Lying down on stomach, arms lift out to side (thumbs pointed to ceiling) Chin tuck lying down Towel roll stretch Shoulder depression (standing reaching to ground)

## 2015-06-27 NOTE — Therapy (Signed)
[facility] [address] [place], [place], [address] Phone: [phone]   Fax:  [phone]  Physical Therapy Treatment  Patient Details  Name: Jody Clarke MRN: [id number] Date of Birth: [birth year] Referring Provider: Melida Quitter, MD  Encounter Date: 06/27/2015      PT End of Session - 06/27/15 1417    Visit Number 11   Number of Visits 13   Date for PT Re-Evaluation 06/16/15   Authorization Type [employer]   Authorization - Visit Number 1   Authorization - Number of Visits 60   PT Start Time 1238   PT Stop Time 1332   PT Time Calculation (min) 54 min   Activity Tolerance Patient tolerated treatment well   Behavior During Therapy [facility] for tasks assessed/performed      Past Medical History  Diagnosis Date  . Allergy     Allergic rhinitis  . Anxiety     Past Surgical History  Procedure Laterality Date  . 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 Tonsillectomy    . Knee surgery      There were no vitals filed for this visit.  Visit Diagnosis:  Stiffness of neck  Muscular weakness  Vestibular disequilibrium, left      Subjective Assessment - 06/27/15 1241    Subjective The patient reports relief from constant discomfort around left scapula.  She did exercises this morning.  She notes some tightness at end range of cervical sidebending.   Patient brought HEP routine for PT to assess and correct.   Patient Stated Goals "To learn some things to help to relieve some of the neck tension and then to understand which symptoms are related to vestibular impairments, and manage those symptoms."   Currently in Pain? No/denies       THERAPEUTIC EXERCISE: Reviewed all home program and home exercise routine.  See patient instructions for patient activities to continue at home.  PT worked throughout session on making mild corrections to current technique for improved performance.Emphasized postural  alignment and positioning and reduced weights/resistance on some activities to improve technique.      PT Education - 06/27/15 1416    Education provided Yes   Education Details reviewed all HEP and home exercise program (d/c "superman" exercise)   Person(s) Educated Patient   Methods Explanation;Demonstration;Handout   Comprehension Returned demonstration;Verbalized understanding          PT Short Term Goals - 05/30/15 1244    PT SHORT TERM GOAL #1   Title Pt will perform initial home exercise program with mod I using paper handout to maximize functional gains made in PT. Target date: 05/23/15   Status Achieved   PT SHORT TERM GOAL #2   Title Complete Neck Disability Index to assess pt-perceived disability due to cervical spine symptoms. Target date: 05/23/15   Baseline NDI = 42%, raw = 21 received 05/19/15   Status Deferred   PT SHORT TERM GOAL #3   Title Pt will improve active cervical spine lateral flexion to >/= 33 degrees bilaterally to progress toward more normalized ROM. Target date: 05/23/15   Baseline Lateral flexion AROM: 20 degrees to L, 21 degrees to R.   Status On-going   PT SHORT TERM GOAL #4   Title Pt will improve active cervical spine rotation to >/= 67 degrees to increase safety with driving. Target date: 05/23/15   Baseline Rotation AROM: 50 degrees to R, 60 degrees to L   Status On-going  PT SHORT TERM GOAL #5   Title Initiate education on fall prevention strategies (focus on multisensory environment) to decrease fall risk in home environment. Target date: 05/23/15   Status Deferred   PT SHORT TERM GOAL #7   Title             PT Long Term Goals - 06/14/15 1157    PT LONG TERM GOAL #1   Title Pt will be independent with home exercise program to maximize functional gains made in PT. Target date: 06/13/15   Time 8   Period Weeks   Status Achieved   PT LONG TERM GOAL #2   Title Improve Neck Disability Index by 15% (or 7.5 points) from baseline to indicate  significant decrease in pt-perceived disability due to cervical spine symptoms  TARGET DATE = 07/12/15   Baseline NDI = 42%, raw = 21 received 05/19/15   Status On-going   PT LONG TERM GOAL #3   Title Pt will improve active cervical spine lateral flexion to >/= 40 degrees bilaterally to indicate improved muscle extensibility/balance. Target date: 06/13/15   Baseline Pt with cervical lateral flexion to R 22 degrees, L 21 degrees   Time 8   Period Weeks   Status Not Met   PT LONG TERM GOAL #4   Title Pt will improve active cervical spine rotation to >/= 75 degrees to maximize safety with driving.  Target date: 06/13/15   Baseline Pt with cervical rotation R = 63 degrees, L = 69 degrees   Time 8   Period Weeks   Status Not Met   PT LONG TERM GOAL #5   Title Pt will perform Craniocervical Flexion Test for 5 increments without compensation to indicate increased endurance in deep neck flexor muscles. Target date: 06/13/15   Baseline Achieved 06/14/15   Time 8   Period Weeks   Status Achieved   PT LONG TERM GOAL #6   Title Pt will decrease DHI score from 52 to </= 34 to indicate significant decrease in pt-perceived disability due to dizziness. TARGET DATE = 07/12/15   Baseline DHI = 52   Time 4   Period Weeks   Status On-going   PT LONG TERM GOAL #7   Title The patient will demonstrate ability to find appropriate posture or self-correct her posture while performing at least 2 exercises during PT session without visual feedback and with <2 cues from PT to demonstrate improved postural awareness.   Baseline TARGET DATE = 07/12/15   Time 4   Period Weeks   Status New               Plan - 06/27/15 1418    Clinical Impression Statement The patient is demonstrating improved postural awareness and can demonstrate correct technique on HEP.  Today's session focused on patient's current exercise routine and ensuring emphasis on correct muscle contraction with emphasis on scapular position and  reducing shoulder internal rotation during movements.  Patient is experiencing decreased pain and improved postural awareness evidenced by correct technique and performance of exercises as well as being able to verbalize when she is not using correct technique on an exercise.   PT Next Visit Plan Review HEP, perform NDI, discharge if exercises going well.   Consulted and Agree with Plan of Care Patient        Problem List Patient Active Problem List   Diagnosis Date Noted  . Adjustment disorder with anxiety 05/03/2015  . Cochleovestibular active Meniere's disease 05/03/2015  .  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 Insomnia 05/03/2015  . Vitamin D deficiency 05/03/2015  . Deviated nasal septum 05/03/2015    Valencia Kassa, PT 06/27/2015, 2:22 PM  [facility] [address] [place], [place], [address] Phone: [phone]   Fax:  [phone]  Name: Jayah Balthazar MRN: [id number] Date of Birth: 01-17-1961

## 2015-07-04 ENCOUNTER — Ambulatory Visit: Payer: Managed Care, Other (non HMO) | Admitting: Rehabilitative and Restorative Service Providers"

## 2015-07-06 ENCOUNTER — Telehealth: Payer: Self-pay | Admitting: Obstetrics & Gynecology

## 2015-07-06 ENCOUNTER — Other Ambulatory Visit: Payer: Self-pay | Admitting: Obstetrics & Gynecology

## 2015-07-06 NOTE — Telephone Encounter (Signed)
Patient calling to speak with nurse regarding her progesterone medication and the pharmacy not filling it.

## 2015-07-06 NOTE — Telephone Encounter (Signed)
Left message to call Eann Cleland at [phone]. 

## 2015-07-07 MED ORDER — ESTRADIOL 1 MG PO TABS: 1.0000 mg | ORAL_TABLET | Freq: Every day | ORAL | Status: DC

## 2015-07-07 NOTE — Telephone Encounter (Signed)
RF for Estradiol 1 mg daily #30 1RF sent to [facility] pharmacy on file.

## 2015-07-07 NOTE — Telephone Encounter (Signed)
Medication refill request: Progesterone  Last AEX:  06-15-14 Next AEX: 08-30-15 Last MMG (if hormonal medication request): 06-24-13 WNL  Refill authorized: please advise

## 2015-07-07 NOTE — Telephone Encounter (Signed)
Spoke with patient. Patient states that the pharmacy advised her she is unable to refill her Progesterone at this time. Last aex was 06/15/2014. Next aex 08/30/2015 with Dr.Miller. Advised patient refill request came from her pharmacy late yesterday afternoon. Refill has been approved for Prometrium 200 mg daily days 1-15 of the month by Dr.Miller and [facility] [place] [facility] [place]. She is agreeable. States she also needs refill of Estradiol 1 mg tablets. Last mmg was 06/23/2013 Birads 1 negative.   Dr.Miller, okay to refill Estradiol 1 mg daily #30 1RF to get her to her aex and advise she will need up to date mammogram for further refills?

## 2015-07-07 NOTE — Telephone Encounter (Signed)
Yes.  Ok to RF the estradiol 1.0mg  daily.  #30/1RF.

## 2015-07-08 NOTE — Telephone Encounter (Signed)
Left message to call Kaitlyn at [phone]. 

## 2015-07-11 ENCOUNTER — Ambulatory Visit: Payer: Managed Care, Other (non HMO) | Admitting: Rehabilitative and Restorative Service Providers"

## 2015-07-11 DIAGNOSIS — M436 Torticollis: Secondary | ICD-10-CM

## 2015-07-11 DIAGNOSIS — M6281 Muscle weakness (generalized): Secondary | ICD-10-CM

## 2015-07-11 NOTE — Therapy (Signed)
[facility] [address] [place], [place], [address] Phone: [phone]   Fax:  [phone]  Physical Therapy Treatment  Patient Details  Name: Jody Clarke MRN: [id number] Date of Birth: 29-Oct-1960 Referring Provider: Melida Quitter, MD  Encounter Date: 07/11/2015      PT End of Session - 07/11/15 1417    Visit Number 12   Number of Visits 13   Date for PT Re-Evaluation 06/16/15   Authorization Type [employer]   Authorization - Visit Number 1   Authorization - Number of Visits 60   PT Start Time 1150   PT Stop Time 0017   PT Time Calculation (min) 45 min   Activity Tolerance Patient tolerated treatment well   Behavior During Therapy [facility] for tasks assessed/performed      Past Medical History  Diagnosis Date  . Allergy     Allergic rhinitis  . Anxiety     Past Surgical History  Procedure Laterality Date  . 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 Tonsillectomy    . Knee surgery      There were no vitals filed for this visit.  Visit Diagnosis:  Stiffness of neck  Muscular weakness      Subjective Assessment - 07/11/15 1154    Subjective The patient reports that her Meniere's symtpoms + allergies are aggravated today and have been for the past week.  She demonstrates a wider base of support with gait.    Patient Stated Goals "To learn some things to help to relieve some of the neck tension and then to understand which symptoms are related to vestibular impairments, and manage those symptoms."   Currently in Pain? No/denies                         [facility] Adult PT Treatment/Exercise - 07/11/15 1418    Self-Care   Self-Care Other Self-Care Comments   Other Self-Care Comments  Discussed inner ear function and relation to neck symptoms.  Patient reports feeling improvement in ability to [facility] current status.  Discussed progression of activities after discharge.     Exercises   Exercises Other Exercises   Other Exercises  Reviewed prone scapular retraction with arms abducted to 90 degrees.  Contract/relax in supine position combined with passive stretching.  Chin to chest supine for stretch posterior aspect of neck.    Manual Therapy   Manual Therapy Joint mobilization;Soft tissue mobilization;Manual Traction   Manual therapy comments patient tender over C3 lateral aspectof spinous process and near lateral L facet joint.  She also has decreased posterior>anterior C3 mobility   Joint Mobilization Grade I-II   Soft tissue mobilization suboccipital muscles   Manual Traction gentle manual distraction to tolerance.                  PT Short Term Goals - 05/30/15 1244    PT SHORT TERM GOAL #1   Title Pt will perform initial home exercise program with mod I using paper handout to maximize functional gains made in PT. Target date: 05/23/15   Status Achieved   PT SHORT TERM GOAL #2   Title Complete Neck Disability Index to assess pt-perceived disability due to cervical spine symptoms. Target date: 05/23/15   Baseline NDI = 42%, raw = 21 received 05/19/15   Status Deferred   PT SHORT TERM GOAL #3   Title Pt will improve active cervical spine lateral flexion to >/=  33 degrees bilaterally to progress toward more normalized ROM. Target date: 05/23/15   Baseline Lateral flexion AROM: 20 degrees to L, 21 degrees to R.   Status On-going   PT SHORT TERM GOAL #4   Title Pt will improve active cervical spine rotation to >/= 67 degrees to increase safety with driving. Target date: 05/23/15   Baseline Rotation AROM: 50 degrees to R, 60 degrees to L   Status On-going   PT SHORT TERM GOAL #5   Title Initiate education on fall prevention strategies (focus on multisensory environment) to decrease fall risk in home environment. Target date: 05/23/15   Status Deferred   PT SHORT TERM GOAL #7   Title             PT Long Term Goals - 07/11/15 1421    PT LONG TERM GOAL  #1   Title Pt will be independent with home exercise program to maximize functional gains made in PT. Target date: 06/13/15   Time 8   Period Weeks   Status Achieved   PT LONG TERM GOAL #2   Title Improve Neck Disability Index by 15% (or 7.5 points) from baseline to indicate significant decrease in pt-perceived disability due to cervical spine symptoms  TARGET DATE = 07/12/15   Baseline NDI = 42%, raw = 21 received 05/19/15  *Reduced to 38% from 42%.   Status Partially Met   PT LONG TERM GOAL #3   Title Pt will improve active cervical spine lateral flexion to >/= 40 degrees bilaterally to indicate improved muscle extensibility/balance. Target date: 06/13/15   Baseline Pt with cervical lateral flexion to R 22 degrees, L 21 degrees   Time 8   Period Weeks   Status Not Met   PT LONG TERM GOAL #4   Title Pt will improve active cervical spine rotation to >/= 75 degrees to maximize safety with driving.  Target date: 06/13/15   Baseline Pt with cervical rotation R = 63 degrees, L = 69 degrees   Time 8   Period Weeks   Status Not Met   PT LONG TERM GOAL #5   Title Pt will perform Craniocervical Flexion Test for 5 increments without compensation to indicate increased endurance in deep neck flexor muscles. Target date: 06/13/15   Baseline Achieved 06/14/15   Time 8   Period Weeks   Status Achieved   PT LONG TERM GOAL #6   Title Pt will decrease DHI score from 52 to </= 34 to indicate significant decrease in pt-perceived disability due to dizziness. TARGET DATE = 07/12/15   Baseline Patient did not retake [place].   Time 4   Period Weeks   Status Deferred   PT LONG TERM GOAL #7   Title The patient will demonstrate ability to find appropriate posture or self-correct her posture while performing at least 2 exercises during PT session without visual feedback and with <2 cues from PT to demonstrate improved postural awareness.   Baseline --   Time 4   Period Weeks   Status Achieved                Plan - 07/12/15 1607    Clinical Impression Statement The patient partially met LTGs.  PT appears to have improved awareness of postural compensations at low back, postural awareness, and HEP abiltiies.  PT has reviewed patient's regular exercise routine to improve technique for long term community wellness.  Patient to continue working on neck ROM and stabilization after d/c.  PT Next Visit Plan discharge.    Consulted and Agree with Plan of Care Patient        Problem List Patient Active Problem List   Diagnosis Date Noted  . Adjustment disorder with anxiety 05/03/2015  . Cochleovestibular active Meniere's disease 05/03/2015  . 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 Insomnia 05/03/2015  . Vitamin D deficiency 05/03/2015  . Deviated nasal septum 05/03/2015    PHYSICAL THERAPY DISCHARGE SUMMARY  Visits from Start of Care: 12   Current functional level related to goals / functional outcomes: See above for goals.   Remaining deficits: Chronic vestibular issues leading to neck guarding.  PT education to help for mgmt.   Education / Equipment: HEP, community program, role of vestibular system in neck tightness.   Plan: Patient agrees to discharge.  Patient goals were partially met. Patient is being discharged due to meeting the stated rehab goals.  ?????       Thank you for the referral of this patient. Rudell Cobb, MPT  [place], PT 07/12/2015, 4:08 PM  [facility] [address] [place], [place], [address] Phone: [phone]   Fax:  [phone]  Name: Mackenzee Becvar MRN: [id number] Date of Birth: 05-24-1960

## 2015-07-14 NOTE — Telephone Encounter (Signed)
Spoke with patient. Advised rx for Estradiol 1 mg po daily with refills until her next aex have been sent to her pharmacy on file. She is agreeable. Advised she is due to have her screening mammogram as last mammogram was performed on 06/23/2013. She is agreeable and will call to schedule her screening mammogram at this time.  Routing to provider for final review. Patient agreeable to disposition. Will close encounter.

## 2015-07-15 ENCOUNTER — Other Ambulatory Visit: Payer: Self-pay

## 2015-07-15 DIAGNOSIS — Z1231 Encounter for screening mammogram for malignant neoplasm of breast: Secondary | ICD-10-CM

## 2015-08-01 ENCOUNTER — Ambulatory Visit
Admission: RE | Admit: 2015-08-01 | Discharge: 2015-08-01 | Disposition: A | Discharge status: Home / Self Care | Payer: Managed Care, Other (non HMO) | Source: Ambulatory Visit

## 2015-08-01 DIAGNOSIS — Z1231 Encounter for screening mammogram for malignant neoplasm of breast: Secondary | ICD-10-CM

## 2015-08-19 ENCOUNTER — Ambulatory Visit: Payer: 59 | Admitting: Obstetrics & Gynecology

## 2015-08-30 ENCOUNTER — Encounter: Payer: Self-pay | Admitting: Obstetrics & Gynecology

## 2015-08-30 ENCOUNTER — Ambulatory Visit (INDEPENDENT_AMBULATORY_CARE_PROVIDER_SITE_OTHER): Payer: Managed Care, Other (non HMO) | Admitting: Obstetrics & Gynecology

## 2015-08-30 VITALS — BP 90/56 | HR 78 | Resp 18 | Ht 63.0 in | Wt 108.6 lb

## 2015-08-30 DIAGNOSIS — Z205 Contact with and (suspected) exposure to viral hepatitis: Secondary | ICD-10-CM | POA: Diagnosis not present

## 2015-08-30 DIAGNOSIS — Z0000 Encounter for general adult medical examination without abnormal findings: Secondary | ICD-10-CM | POA: Diagnosis not present

## 2015-08-30 DIAGNOSIS — Z01419 Encounter for gynecological examination (general) (routine) without abnormal findings: Secondary | ICD-10-CM | POA: Diagnosis not present

## 2015-08-30 DIAGNOSIS — Z1211 Encounter for screening for malignant neoplasm of colon: Secondary | ICD-10-CM | POA: Diagnosis not present

## 2015-08-30 DIAGNOSIS — H8109 Meniere's disease, unspecified ear: Secondary | ICD-10-CM

## 2015-08-30 LAB — COMPREHENSIVE METABOLIC PANEL
ALT: 18 U/L (ref 6–29)
AST: 21 U/L (ref 10–35)
Albumin: 4.5 g/dL (ref 3.6–5.1)
Alkaline Phosphatase: 47 U/L (ref 33–130)
BUN: 23 mg/dL (ref 7–25)
CALCIUM: 9.4 mg/dL (ref 8.6–10.4)
CO2: 30 mmol/L (ref 20–31)
Chloride: 98 mmol/L (ref 98–110)
Creat: 0.75 mg/dL (ref 0.50–1.05)
GLUCOSE: 76 mg/dL (ref 65–99)
POTASSIUM: 4 mmol/L (ref 3.5–5.3)
Sodium: 138 mmol/L (ref 135–146)
Total Bilirubin: 0.4 mg/dL (ref 0.2–1.2)
Total Protein: 7.1 g/dL (ref 6.1–8.1)

## 2015-08-30 LAB — POCT URINALYSIS DIPSTICK
BILIRUBIN UA: NEGATIVE
GLUCOSE UA: NEGATIVE
Ketones, UA: NEGATIVE
Leukocytes, UA: NEGATIVE
Nitrite, UA: NEGATIVE
PH UA: 6
Protein, UA: NEGATIVE
RBC UA: NEGATIVE
UROBILINOGEN UA: NEGATIVE

## 2015-08-30 LAB — LIPID PANEL
CHOL/HDL RATIO: 2.9 ratio (ref <=5.0)
Cholesterol: 171 mg/dL (ref 125–200)
HDL: 58 mg/dL (ref >=46)
LDL CALC: 97 mg/dL (ref <130)
Triglycerides: 78 mg/dL (ref <150)
VLDL: 16 mg/dL (ref <30)

## 2015-08-30 LAB — CBC
HEMATOCRIT: 43 % (ref 35.0–45.0)
Hemoglobin: 14.6 g/dL (ref 11.7–15.5)
MCH: 30.7 pg (ref 27.0–33.0)
MCHC: 34 g/dL (ref 32.0–36.0)
MCV: 90.5 fL (ref 80.0–100.0)
MPV: 9.7 fL (ref 7.5–12.5)
Platelets: 301 10*3/uL (ref 140–400)
RBC: 4.75 MIL/uL (ref 3.80–5.10)
RDW: 13.2 % (ref 11.0–15.0)
WBC: 5.7 10*3/uL (ref 3.8–10.8)

## 2015-08-30 MED ORDER — ESTRADIOL 1 MG PO TABS: 1.0000 mg | ORAL_TABLET | Freq: Every day | ORAL | Status: DC

## 2015-08-30 MED ORDER — PROGESTERONE MICRONIZED 200 MG PO CAPS: 200.0000 mg | ORAL_CAPSULE | Freq: Every day | ORAL | Status: DC

## 2015-08-30 NOTE — Progress Notes (Signed)
55 y.o. G2P1 MarriedCaucasianF here for annual exam.  Doing well.  Denies vaginal bleeding.  Has not had major issues with Meniere's disease.  Seeing Dr. Crista Luria at [facility].  Has begun the disability process.  May end up transferring care to [facility] just due to distance.  Declines colonoscopy.  Has anxiety about having dizziness issues related to the Meniere's.    PCP:  Dr. Wylene Simmer.  Has been seen within the past year.  Does lab work when   Patient's last menstrual period was 04/16/2008.          Sexually active: Yes.    The current method of family planning is post menopausal status.    Exercising: Yes.    aerobics, weight training Smoker:  no  Health Maintenance: Pap:  06/15/2014 negative, neg pap with Neg HPV 2013 History of abnormal Pap:  no MMG:  08/02/2015 BIRADS 1 negative  Colonoscopy:  declines BMD:   10/21/2009  TDaP:  unsure Screening Labs: wait until sees MD, Hb today: wait until sees MD, Urine today: wait until sees MD   reports that she has never smoked. She has never used smokeless tobacco. She reports that she does not drink alcohol or use illicit drugs.  Past Medical History  Diagnosis Date  . Anxiety   . Meniere's disease   . Osteopenia     Past Surgical History  Procedure Laterality Date  . Knee arthroscopy      right  . Endolymphatic shunt decompression  8/14    in [facility]    Current Outpatient Prescriptions  Medication Sig Dispense Refill  . Calcium Carbonate-Vitamin D 600-200 MG-UNIT TABS Take by mouth.    . diazepam (VALIUM) 5 MG tablet Take 5 mg by mouth as needed for anxiety.    Marland Kitchen estradiol (ESTRACE) 1 MG tablet Take 1 tablet (1 mg total) by mouth daily. 30 tablet 1  . lactobacillus acidophilus (BACID) TABS tablet Take 2 tablets by mouth 3 (three) times daily.    . Multiple Vitamins-Minerals (MULTIVITAMIN PO) Take by mouth daily.    . progesterone (PROMETRIUM) 200 MG capsule TAKE 1 TABLET BY MOUTH DAILY ON DAYS 1-15 OF THE MONTH 15 capsule 2   . RESTASIS 0.05 % ophthalmic emulsion Place 1 drop into both eyes daily.    . sertraline (ZOLOFT) 100 MG tablet Take 100 mg by mouth daily.    . Triamcinolone Acetonide (NASACORT AQ NA) Place into the nose.    . triamterene-hydrochlorothiazide (DYAZIDE) 37.5-25 MG per capsule Take 1 capsule by mouth daily.     No current facility-administered medications for this visit.    Family History  Problem Relation Age of Onset  . Atrial fibrillation Father   . Osteoporosis Mother     ROS:  Pertinent items are noted in HPI.  Otherwise, a comprehensive ROS was negative.  Exam:   General appearance: alert, cooperative and appears stated age Head: Normocephalic, without obvious abnormality, atraumatic Neck: no adenopathy, supple, symmetrical, trachea midline and thyroid normal to inspection and palpation Lungs: clear to auscultation bilaterally Breasts: normal appearance, no masses or tenderness Heart: regular rate and rhythm Abdomen: soft, non-tender; bowel sounds normal; no masses,  no organomegaly Extremities: extremities normal, atraumatic, no cyanosis or edema Skin: Skin color, texture, turgor normal. No rashes or lesions Lymph nodes: Cervical, supraclavicular, and axillary nodes normal. No abnormal inguinal nodes palpated Neurologic: Grossly normal   Pelvic: External genitalia:  no lesions  Urethra:  normal appearing urethra with no masses, tenderness or lesions              Bartholins and Skenes: normal                 Vagina: normal appearing vagina with normal color and discharge, no lesions              Cervix: no lesions              Pap taken: No. Bimanual Exam:  Uterus:  normal size, contour, position, consistency, mobility, non-tender              Adnexa: normal adnexa and no mass, fullness, tenderness               Rectovaginal: Confirms               Anus:  normal sphincter tone, no lesions  Chaperone was present for exam.  A:  Well Woman with normal  exam PMP, no HRT Meniere's disease Anxiety Osteopenia  P: Mammogram yearly. Colonoscopy discussed. Pt declines.  IFOB given as pt didn't do one with Dr. Wylene Simmerisovec this past year. Estrace 1mg  (pt takes 1/2 tab daily) and Prometrium 200mg  daily 1-15 each month. Rx to pharmacy. She and I have decided not to made any changes with her HRT. pap smear with neg HR HPV 11/13. Pap 2016.  No pap today CMP, CBC, TSH, Vit D, Lipids Hep C ab today Return annually or prn

## 2015-08-31 LAB — HEPATITIS C ANTIBODY: HCV Ab: NEGATIVE

## 2015-08-31 LAB — TSH: TSH: 2.3 m[IU]/L

## 2015-08-31 LAB — VITAMIN D 25 HYDROXY (VIT D DEFICIENCY, FRACTURES): Vit D, 25-Hydroxy: 28 ng/mL — ABNORMAL LOW (ref 30–100)

## 2015-09-27 LAB — FECAL OCCULT BLOOD, IMMUNOCHEMICAL: IMMUNOLOGICAL FECAL OCCULT BLOOD TEST: NEGATIVE

## 2015-09-27 NOTE — Addendum Note (Signed)
Addended by: Sheran LuzASHE, Tearra Ouk M on: 09/27/2015 08:41 AM   Modules accepted: Orders

## 2015-10-15 ENCOUNTER — Other Ambulatory Visit: Payer: Self-pay | Admitting: Obstetrics & Gynecology

## 2015-10-17 NOTE — Telephone Encounter (Signed)
Spoke with patient, she does not have refills; pharmacy stated that there were no refills. Last filled #15 w/13 refills. Today #15 w/9 refills? Please advise

## 2015-10-17 NOTE — Telephone Encounter (Signed)
Medication refill request: Progesterone 200mg  Last AEX:  08/30/15 SM Next AEX: none scheduled yet Last MMG (if hormonal medication request): 08/02/15 BIRADS1 negative Refill authorized: 08/30/15 #15 w/13 refills;  Will call patient to verify that she has refills.

## 2015-10-27 ENCOUNTER — Encounter: Payer: Self-pay | Admitting: Obstetrics & Gynecology

## 2016-01-27 ENCOUNTER — Other Ambulatory Visit: Payer: Self-pay

## 2016-01-27 MED ORDER — ESTRADIOL 1 MG PO TABS: 1.0000 mg | ORAL_TABLET | Freq: Every day | ORAL | 2 refills | Status: DC

## 2016-01-27 NOTE — Telephone Encounter (Signed)
Medication refill request: Estrace 1mg  Last AEX:  08/30/15 SM Next AEX: not yet scheduled Last MMG (if hormonal medication request): 08/02/15 BIRADS1 negative Refill authorized: 08/30/15 #30 w/13 refills; today please advise in the absence of Dr. Hyacinth MeekerMiller. Request for 90 day supply

## 2016-03-19 ENCOUNTER — Telehealth: Payer: Self-pay | Admitting: Allergy

## 2016-03-19 NOTE — Telephone Encounter (Signed)
Fax records from paper chart to [phone] per Dr. Beaulah DinningBardelas.

## 2016-03-29 ENCOUNTER — Telehealth: Payer: Self-pay | Admitting: Rehabilitative and Restorative Service Providers"

## 2016-03-29 NOTE — Telephone Encounter (Signed)
Patient called front office requesting a call back.  She notes her lawyer for disability is requesting further paperwork from PT (a form to be filled out).  PT explained that we typically do not fill out disability forms, however if she needs further input or clarification above our medical notes, she can drop it by our office.  Typically, MD completes disability forms. Mindi Akerson, PT [facility] [address] [place], [place]  [address] Phone:  [phone] Fax:  [phone]

## 2016-08-01 ENCOUNTER — Other Ambulatory Visit: Payer: Self-pay | Admitting: Obstetrics and Gynecology

## 2016-08-02 ENCOUNTER — Other Ambulatory Visit: Payer: Self-pay | Admitting: Obstetrics & Gynecology

## 2016-08-02 DIAGNOSIS — Z1231 Encounter for screening mammogram for malignant neoplasm of breast: Secondary | ICD-10-CM

## 2016-08-02 NOTE — Telephone Encounter (Signed)
Medication refill request: Progesterone Last AEX:  08/30/15 SM Next AEX: 09/04/16 SM Last MMG (if hormonal medication request): 08/01/15 BIRADS1, Density C, TBC Refill authorized: 10/17/15 #15 9R. Please advise. Thank you.

## 2016-08-27 ENCOUNTER — Ambulatory Visit
Admission: RE | Admit: 2016-08-27 | Discharge: 2016-08-27 | Disposition: A | Discharge status: Home / Self Care | Payer: Managed Care, Other (non HMO) | Source: Ambulatory Visit | Attending: Obstetrics & Gynecology | Admitting: Obstetrics & Gynecology

## 2016-08-27 DIAGNOSIS — Z1231 Encounter for screening mammogram for malignant neoplasm of breast: Secondary | ICD-10-CM

## 2016-09-04 ENCOUNTER — Other Ambulatory Visit (HOSPITAL_COMMUNITY)
Admission: RE | Admit: 2016-09-04 | Discharge: 2016-09-04 | Disposition: A | Discharge status: Home / Self Care | Payer: Managed Care, Other (non HMO) | Source: Ambulatory Visit | Attending: Obstetrics & Gynecology | Admitting: Obstetrics & Gynecology

## 2016-09-04 ENCOUNTER — Ambulatory Visit (INDEPENDENT_AMBULATORY_CARE_PROVIDER_SITE_OTHER): Payer: Managed Care, Other (non HMO) | Admitting: Obstetrics & Gynecology

## 2016-09-04 ENCOUNTER — Encounter: Payer: Self-pay | Admitting: Obstetrics & Gynecology

## 2016-09-04 VITALS — BP 92/60 | HR 90 | Resp 16 | Ht 62.75 in | Wt 109.0 lb

## 2016-09-04 DIAGNOSIS — Z01419 Encounter for gynecological examination (general) (routine) without abnormal findings: Secondary | ICD-10-CM

## 2016-09-04 DIAGNOSIS — Z1211 Encounter for screening for malignant neoplasm of colon: Secondary | ICD-10-CM

## 2016-09-04 DIAGNOSIS — Z124 Encounter for screening for malignant neoplasm of cervix: Secondary | ICD-10-CM

## 2016-09-04 DIAGNOSIS — M8580 Other specified disorders of bone density and structure, unspecified site: Secondary | ICD-10-CM

## 2016-09-04 MED ORDER — PROGESTERONE MICRONIZED 100 MG PO CAPS: 100.0000 mg | ORAL_CAPSULE | Freq: Every day | ORAL | 12 refills | Status: DC

## 2016-09-04 MED ORDER — ESTRADIOL 1 MG PO TABS: 1.0000 mg | ORAL_TABLET | Freq: Every day | ORAL | 3 refills | Status: DC

## 2016-09-04 NOTE — Patient Instructions (Addendum)
[email or web address]  Remember to do a bone density with your mammogram next year.

## 2016-09-04 NOTE — Progress Notes (Signed)
56 y.o. G2P0000 MarriedCaucasianF here for annual exam.  Doing well.  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  Still seeing Dr. Lenoria Farrier at [facility].  Is stable.  Meniere's disease is an intermittent issue with her.  She's fairly stable.  Denies vaginal bleeding.    Patient's last menstrual period was 04/16/2008.          Sexually active: Yes.    The current method of family planning is post menopausal status.    Exercising: Yes.    Walking, treadmill Smoker:  no  Health Maintenance: Pap:  06/15/14 Neg   02/2012 Neg. HR HPV:neg  History of abnormal Pap:  no MMG:  08/28/16 BIRADS1:neg  Colonoscopy:  Declines  BMD:   10/2009 Osteopenia TDaP:  Unsure Pneumonia vaccine(s):  No Zostavax:   No Hep C testing: 08/30/15 Neg  Screening Labs: PCP   reports that she has never smoked. She has never used smokeless tobacco. She reports that she does not drink alcohol or use drugs.  Past Medical History:  Diagnosis Date  . Allergy    Allergic rhinitis  . Anxiety   . Meniere's disease   . Osteopenia     Past Surgical History:  Procedure Laterality Date  . ENDOLYMPHATIC SHUNT DECOMPRESSION  8/14   in [place]  . inner ear endolymphatic sac operation with shunt Left 2014  . KNEE ARTHROSCOPY     right  . KNEE SURGERY    . TONSILLECTOMY      Current Outpatient Prescriptions  Medication Sig Dispense Refill  . Azelastine-Fluticasone 137-50 MCG/ACT SUSP 2 sprays by Each Nare route.    . Calcium Carbonate-Vitamin D 600-200 MG-UNIT TABS Take by mouth.    . cetirizine (ZYRTEC) 10 MG tablet Take 10 mg by mouth daily.    . diazepam (VALIUM) 5 MG tablet Take 5 mg by mouth as needed for anxiety.    Marland Kitchen estradiol (ESTRACE) 1 MG tablet Take 1 tablet (1 mg total) by mouth daily. 90 tablet 2  . lactobacillus acidophilus (BACID) TABS tablet Take 2 tablets by mouth 3 (three) times daily.    . Multiple Vitamins-Minerals (MULTIVITAMIN PO) Take by mouth daily.    . progesterone (PROMETRIUM) 200 MG capsule TAKE 1  TABLET BY MOUTH ONCE DAILY ON DAYS 1-15 OF THE MONTH 15 capsule 1  . triamcinolone (NASACORT) 55 MCG/ACT AERO nasal inhaler Place into the nose.    . triamterene-hydrochlorothiazide (DYAZIDE) 37.5-25 MG per capsule Take 1 capsule by mouth daily.     No current facility-administered medications for this visit.     Family History  Problem Relation Age of Onset  . Cancer Father   . Atrial fibrillation Father   . Osteoporosis Mother     ROS:  Pertinent items are noted in HPI.  Otherwise, a comprehensive ROS was negative.  Exam:   BP 92/60 (BP Location: Right Arm, Patient Position: Sitting, Cuff Size: Normal)   Pulse 90   Resp 16   Ht 5' 2.75" (1.594 m)   Wt 109 lb (49.4 kg)   LMP 04/16/2008   BMI 19.46 kg/m   Weight change: +1#  Height: 5' 2.75" (159.4 cm)  Ht Readings from Last 3 Encounters:  09/04/16 5' 2.75" (1.594 m)  08/30/15 5\' 3"  (1.6 m)  06/15/14 5' 2.75" (1.594 m)    General appearance: alert, cooperative and appears stated age Head: Normocephalic, without obvious abnormality, atraumatic Neck: no adenopathy, supple, symmetrical, trachea midline and thyroid normal to inspection and palpation Lungs: clear to  auscultation bilaterally Breasts: normal appearance, no masses or tenderness Heart: regular rate and rhythm Abdomen: soft, non-tender; bowel sounds normal; no masses,  no organomegaly Extremities: extremities normal, atraumatic, no cyanosis or edema Skin: Skin color, texture, turgor normal. No rashes or lesions Lymph nodes: Cervical, supraclavicular, and axillary nodes normal. No abnormal inguinal nodes palpated Neurologic: Grossly normal   Pelvic: External genitalia:  no lesions              Urethra:  normal appearing urethra with no masses, tenderness or lesions              Bartholins and Skenes: normal                 Vagina: normal appearing vagina with normal color and discharge, no lesions              Cervix: no lesions              Pap taken: Yes.    Bimanual Exam:  Uterus:  normal size, contour, position, consistency, mobility, non-tender              Adnexa: normal adnexa and no mass, fullness, tenderness               Rectovaginal: Confirms               Anus:  normal sphincter tone, no lesions  Chaperone was present for exam.  A:  Well Woman with normal exam PMP., no HRT Meniere's disease Anxiety Osteopenia  P:   Mammogram guidelines reviewed BMD ordered for pt to do with mammogram in 2019 Continues to declined colonoscopy.  Information given to pt about cologuard.  Ifob given. pap smear with HR HPV  Estradiol 1.0, 1/2 tab daily, and Prometrium (change to 100mg  nightly).  #30/12RF Lab work done with Dr. Wylene Simmerisovec Reviewed shingles vaccination with pt Return annually or prn

## 2016-09-06 LAB — CYTOLOGY - PAP
Diagnosis: NEGATIVE
HPV: NOT DETECTED

## 2016-10-03 ENCOUNTER — Telehealth: Payer: Self-pay | Admitting: Obstetrics & Gynecology

## 2016-10-03 NOTE — Telephone Encounter (Signed)
I changed the dosage when she was here for her last visit from 200mg  days 1-15 to 100mg  so I'm not sure this is related.  However, it is ok to change progesterones and see if this makes a difference.  I would recommend being seen by a provider who can also just look inside to make sure it does not obviously look like something else.  Ok to change to provera 2.5mg  nighty/daily.  Ok to take this anytime during the day unlike Prometrium which I recommend at night.  Ok to send to pharmacy.

## 2016-10-03 NOTE — Telephone Encounter (Signed)
Spoke with patient, advised as seen below per Dr. Hyacinth MeekerMiller. Patient states she likes that the Prometrium helped her sleep at night, does not want to change RX yet. Patient states she will f/u with pcp to have mouth ulcers looked at and will return call if she decides to change to provera. Patient thankful for return call and recommendations, verbalizes understanding.   Routing to provider for final review. Patient is agreeable to disposition. Will close encounter.

## 2016-10-03 NOTE — Telephone Encounter (Signed)
Patient has been getting a lot of mouth ulcers.  Taking progesterone everyday and wondering if this could be the cause of the ulcers

## 2016-10-03 NOTE — Telephone Encounter (Signed)
Spoke with patient. Patient states she has been taking progesterone, recently changed to daily at a reduced dose. Patient states when taking days 1-15 she was noticing occasional ulcers inside her mouth, about 1 q3 weeks. Patient states since switching to daily dose she is noticing them on a weekly basis. Patient states nothing else has changed, has done research and thinks mouth ulcers possibly r/t progesterone. Patient would like Dr. Rondel BatonMiller's opinion if this is a possibility, if not will see pcp for further evaluation if needed. Advised patient mouth ulcers not listed as common side effect, recommended further evaluation. Advised patient Dr. Hyacinth MeekerMiller is out of the office today, can review with covering provider and return call. Patient states she would prefer Dr. Hyacinth MeekerMiller to review when she return to the office. Advised patient response may not be immediate, would return call with recommendations. Patient is agreeable.   Dr. Hyacinth MeekerMiller -please review and advise?

## 2016-11-12 ENCOUNTER — Telehealth: Payer: Self-pay | Admitting: *Deleted

## 2016-11-12 NOTE — Telephone Encounter (Signed)
Message left to return call to Stony Stegmann at [phone].    

## 2016-11-12 NOTE — Telephone Encounter (Signed)
Returned call to patient. Patient states that she has not made a decision if she would like to do the IFob yet, states she has been traveling a lot this summer and would like to discuss with husband before deciding what she wanted to do. RN advised patient to call office if she needed any assistance.   Routing to provider for final review. Patient agreeable to disposition. Will close encounter.

## 2016-11-12 NOTE — Telephone Encounter (Signed)
[facility]

## 2016-11-12 NOTE — Telephone Encounter (Signed)
-----   Message from Jody BearsMary S Miller, MD sent at 11/10/2016  6:17 AM EDT ----- Regarding: [name] has not returned her Ifob test.  Could you call her and remind her that this hasn't been done and see if she will return it?  Please make phone note.  Thanks.  Rosalita ChessmanSuzanne

## 2017-05-07 ENCOUNTER — Telehealth: Payer: Self-pay | Admitting: Obstetrics & Gynecology

## 2017-05-07 DIAGNOSIS — N950 Postmenopausal bleeding: Secondary | ICD-10-CM

## 2017-05-07 NOTE — Telephone Encounter (Signed)
Spoke with patient. Patient states that she has been having intermittent spotting for 2 weeks. States she may have doubled up on her Progesterone a couple times over the last few weeks as she was having trouble remembering if she took it some nights. Reports bleeding is a light spotting that occurs every other day. Patient reports she has been on HRT for 4 years without problems. Advised will review with Dr.Miller and return call with further recommendations. Patient is agreeable.

## 2017-05-07 NOTE — Telephone Encounter (Signed)
I think we should do an PUS and assess the endometrium.  Then can decide if needs biopsy or any changes to medication.  Thanks.

## 2017-05-07 NOTE — Telephone Encounter (Signed)
Patient has been on HRT for about 4 years now and started noticing a little break through spotting off and on for about 2 weeks.  Just wanted to see if she should be concerned or is this normal.

## 2017-05-08 NOTE — Telephone Encounter (Signed)
Spoke with patient. Advised of message as seen below from Dr.Miller. Patient verbalizes understanding. PUS scheduled for tomorrow 05/09/2017 at 1 pm with 1:30 pm consult with Dr.Miller. Patient is agreeable to date and time. Order placed. Encounter closed.

## 2017-05-09 ENCOUNTER — Ambulatory Visit (INDEPENDENT_AMBULATORY_CARE_PROVIDER_SITE_OTHER): Payer: Managed Care, Other (non HMO)

## 2017-05-09 ENCOUNTER — Ambulatory Visit (INDEPENDENT_AMBULATORY_CARE_PROVIDER_SITE_OTHER): Payer: Managed Care, Other (non HMO) | Admitting: Obstetrics & Gynecology

## 2017-05-09 VITALS — BP 104/70 | HR 76 | Resp 16 | Wt 109.0 lb

## 2017-05-09 DIAGNOSIS — N950 Postmenopausal bleeding: Secondary | ICD-10-CM

## 2017-05-09 MED ORDER — PROGESTERONE MICRONIZED 100 MG PO CAPS: 100.0000 mg | ORAL_CAPSULE | Freq: Every day | ORAL | 4 refills | Status: DC

## 2017-05-09 MED ORDER — ESTRADIOL 0.5 MG PO TABS: 1.0000 mg | ORAL_TABLET | Freq: Every day | ORAL | 3 refills | Status: DC

## 2017-05-09 NOTE — Progress Notes (Signed)
57 y.o. 362P0000 Married Caucasian female here for pelvic ultrasound due to episode of PMP that occurred after she may have messed up taking her HRT.  Bleeding has stopped.  Denies cramping or pain..  Patient's last menstrual period was 04/16/2008.  Contraception: PMP  Findings:  UTERUS: 5.5 x 5.4 x 2.6cm EMS: 0.558mm ADNEXA: Left ovary: 2.0 x 0.9 x 0.8cm       Right ovary: 2.2 x 1.1 x 1.0cm CUL DE SAC: no free fluid  Discussion:  Findings reviewed.  Pt aware that endometrium is thin and risk of abnormal pathology is very low.  Things "messing up" her HRT was the cause.  Has been able to wean down to 0.5mg  estradiol.  Would like rx written this way so she can stop cutting pills in half.  Would like to try and taper further.  Wants to go slowly as does not want to do anything that will change dry eyes.     Assessment:  PMP bleeding that has stopped Thin endometrium  Plan:  Continue estradiol 0.5mg .  Rx to pharmacy.  Pt may consider cutting these in 1/2 as well to wean further down/off HRT.  Going very slowly with these changes as wants to make sure there are no side effects before lowing the dosage. Pt knows to call back with any further bleeding.  Would likely perform endometrial biopsy at that time.  ~15 minutes spent with patient >50% of time was in face to face discussion of above.

## 2017-05-12 ENCOUNTER — Encounter: Payer: Self-pay | Admitting: Obstetrics & Gynecology

## 2017-11-21 ENCOUNTER — Other Ambulatory Visit: Payer: Self-pay | Admitting: Obstetrics & Gynecology

## 2017-11-21 DIAGNOSIS — Z1231 Encounter for screening mammogram for malignant neoplasm of breast: Secondary | ICD-10-CM

## 2017-11-26 ENCOUNTER — Ambulatory Visit: Payer: Managed Care, Other (non HMO) | Admitting: Obstetrics & Gynecology

## 2018-01-09 ENCOUNTER — Ambulatory Visit
Admission: RE | Admit: 2018-01-09 | Discharge: 2018-01-09 | Disposition: A | Discharge status: Home / Self Care | Payer: Managed Care, Other (non HMO) | Source: Ambulatory Visit | Attending: Obstetrics & Gynecology | Admitting: Obstetrics & Gynecology

## 2018-01-09 DIAGNOSIS — M8580 Other specified disorders of bone density and structure, unspecified site: Secondary | ICD-10-CM

## 2018-01-09 DIAGNOSIS — Z1231 Encounter for screening mammogram for malignant neoplasm of breast: Secondary | ICD-10-CM

## 2018-03-20 LAB — TSH: TSH: 2.55 (ref 0.41–5.90)

## 2018-03-20 LAB — LIPID PANEL
Cholesterol: 203 — AB (ref 0–200)
HDL: 53 (ref 35–70)
LDL Cholesterol: 142
Triglycerides: 41 (ref 40–160)

## 2018-03-20 LAB — BASIC METABOLIC PANEL
BUN: 17 (ref 4–21)
Creatinine: 0.8 (ref 0.5–1.1)
Glucose: 95
Potassium: 4.4 (ref 3.4–5.3)
Sodium: 140 (ref 137–147)

## 2018-03-21 ENCOUNTER — Ambulatory Visit (INDEPENDENT_AMBULATORY_CARE_PROVIDER_SITE_OTHER): Payer: Managed Care, Other (non HMO) | Admitting: Obstetrics & Gynecology

## 2018-03-21 ENCOUNTER — Encounter: Payer: Self-pay | Admitting: Obstetrics & Gynecology

## 2018-03-21 VITALS — BP 110/68 | HR 88 | Resp 16 | Ht 62.5 in | Wt 110.8 lb

## 2018-03-21 DIAGNOSIS — Z01419 Encounter for gynecological examination (general) (routine) without abnormal findings: Secondary | ICD-10-CM

## 2018-03-21 MED ORDER — ESTRADIOL 0.1 MG/GM VA CREA: TOPICAL_CREAM | VAGINAL | 6 refills | Status: DC

## 2018-03-21 MED ORDER — ESTRADIOL 0.5 MG PO TABS: 1.0000 mg | ORAL_TABLET | Freq: Every day | ORAL | 3 refills | Status: DC

## 2018-03-21 MED ORDER — PROGESTERONE MICRONIZED 100 MG PO CAPS: 100.0000 mg | ORAL_CAPSULE | Freq: Every day | ORAL | 4 refills | Status: DC

## 2018-03-21 NOTE — Patient Instructions (Signed)
If going to taper estrogen, start by cutting some of them in 1/2 and alternating a whole table and half tablet every other day.  Do this for four weeks before making any other change.  After four weeks, you could decrease to 1/2 tab daily.  Then after another four week, you can start taking 1/2 tab every other day.  Stay on the progesterone through any estrogen changes that you make.

## 2018-03-21 NOTE — Progress Notes (Signed)
57 y.o. G2P0000 Married White or Caucasian female here for annual exam.  Doing well.  Very happy with beach house at [place].  [facility].  Broke her finger last year.  Waited to be seen so didn't end up having stitches.    Denies vaginal bleeding.    Considering decreasing her estrogen dosage.  Declines doing colonoscopy.  D/w pt cologuard today.  Pt reports she's been having some issues with urinary urgency.  She's been tested for having cystitis  Patient's last menstrual period was 04/16/2008.          Sexually active: Yes.    The current method of family planning is post menopausal status.    Exercising: Yes.    treadmill 20-30 min, light weights 2-3 x weekly  Smoker:  no  Health Maintenance: Pap:  09/04/16 Neg. HR HPV:neg  06/15/14 Neg  History of abnormal Pap:  no MMG:  01/09/18 BIRADS1:neg  Colonoscopy:  Never BMD:   01/09/18 Osteopenia  TDaP:  unsure Pneumonia vaccine(s):  n/a Shingrix:  Considering having this done Hep C testing: 08/30/15 Neg  Screening Labs: PCP   reports that she has never smoked. She has never used smokeless tobacco. She reports that she drinks alcohol. She reports that she does not use drugs.  Past Medical History:  Diagnosis Date  . Allergy    Allergic rhinitis  . Anxiety   . Meniere's disease   . Osteopenia     Past Surgical History:  Procedure Laterality Date  . ENDOLYMPHATIC SHUNT DECOMPRESSION  8/14   in [place]  . inner ear endolymphatic sac operation with shunt Left 2014  . KNEE ARTHROSCOPY     right  . KNEE SURGERY    . TONSILLECTOMY      Current Outpatient Medications  Medication Sig Dispense Refill  . azelastine (ASTELIN) 0.1 % nasal spray daily as needed.  5  . cetirizine (ZYRTEC) 10 MG tablet Take 10 mg by mouth daily.    . diazepam (VALIUM) 5 MG tablet Take 5 mg by mouth as needed for anxiety.    Marland Kitchen. estradiol (ESTRACE) 0.5 MG tablet Take 2 tablets (1 mg total) by mouth daily. 90 tablet 3  .  lactobacillus acidophilus (BACID) TABS tablet Take 2 tablets by mouth 3 (three) times daily.    . Multiple Vitamins-Minerals (MULTIVITAMIN PO) Take by mouth daily.    . progesterone (PROMETRIUM) 100 MG capsule Take 1 capsule (100 mg total) by mouth daily. 90 capsule 4  . triamcinolone (KENALOG) 0.1 % paste daily as needed.    . triamterene-hydrochlorothiazide (DYAZIDE) 37.5-25 MG per capsule Take 1 capsule by mouth daily.     No current facility-administered medications for this visit.     Family History  Problem Relation Age of Onset  . Cancer Father   . Atrial fibrillation Father   . Osteoporosis Mother   . Breast cancer Neg Hx     Review of Systems  All other systems reviewed and are negative.   Exam:   BP 110/68 (BP Location: Right Arm, Patient Position: Sitting, Cuff Size: Normal)   Pulse 88   Resp 16   Ht 5' 2.5" (1.588 m)   Wt 110 lb 12.8 oz (50.3 kg)   LMP 04/16/2008   BMI 19.94 kg/m   Height: 5' 2.5" (158.8 cm)  Ht Readings from Last 3 Encounters:  03/21/18 5' 2.5" (1.588 m)  09/04/16 5' 2.75" (1.594 m)  08/30/15 5\' 3"  (1.6 m)  General appearance: alert, cooperative and appears stated age Head: Normocephalic, without obvious abnormality, atraumatic Neck: no adenopathy, supple, symmetrical, trachea midline and thyroid normal to inspection and palpation Lungs: clear to auscultation bilaterally Breasts: normal appearance, no masses or tenderness Heart: regular rate and rhythm Abdomen: soft, non-tender; bowel sounds normal; no masses,  no organomegaly Extremities: extremities normal, atraumatic, no cyanosis or edema Skin: Skin color, texture, turgor normal. No rashes or lesions Lymph nodes: Cervical, supraclavicular, and axillary nodes normal. No abnormal inguinal nodes palpated Neurologic: Grossly normal   Pelvic: External genitalia:  no lesions              Urethra:  normal appearing urethra with no masses, tenderness or lesions              Bartholins and  Skenes: normal                 Vagina: normal appearing vagina with normal color and discharge, no lesions              Cervix: no lesions              Pap taken: No. Bimanual Exam:  Uterus:  normal size, contour, position, consistency, mobility, non-tender              Adnexa: normal adnexa and no mass, fullness, tenderness               Rectovaginal: Confirms               Anus:  normal sphincter tone, no lesions  Chaperone was present for exam.  A:  Well Woman with normal exam PMP, no HRT Meniere's disease Anxiety Osteopenia  P:   Mammogram guidelines reviewed pap smear with neg HR HPV 2018 Declines colonoscopy.  Cologuard order given. BMD should be repeated in 2 years Considering tapering off HRT.  Instructions given.  Rx for estradiol 0.5mg  daily and prometrium 100mg  daily to pharmacy. RX for Estradiol cream to pharmacy return annually or prn

## 2018-04-23 ENCOUNTER — Other Ambulatory Visit: Payer: Self-pay | Admitting: *Deleted

## 2018-04-23 ENCOUNTER — Telehealth: Payer: Self-pay | Admitting: *Deleted

## 2018-04-23 DIAGNOSIS — Z1212 Encounter for screening for malignant neoplasm of rectum: Secondary | ICD-10-CM

## 2018-04-23 DIAGNOSIS — Z1211 Encounter for screening for malignant neoplasm of colon: Secondary | ICD-10-CM

## 2018-04-23 LAB — COLOGUARD: Cologuard: NEGATIVE

## 2018-04-23 NOTE — Telephone Encounter (Signed)
Call to patient. Patient notified of negative cologuard results and verbalized understanding.   Encounter closed.  

## 2018-04-28 ENCOUNTER — Encounter: Payer: Self-pay | Admitting: Obstetrics & Gynecology

## 2018-09-10 ENCOUNTER — Encounter: Payer: Self-pay | Admitting: Family Medicine

## 2018-09-10 ENCOUNTER — Other Ambulatory Visit: Payer: Self-pay

## 2018-09-10 ENCOUNTER — Ambulatory Visit (INDEPENDENT_AMBULATORY_CARE_PROVIDER_SITE_OTHER): Payer: Managed Care, Other (non HMO) | Admitting: Family Medicine

## 2018-09-10 VITALS — BP 102/64 | HR 86 | Temp 98.9°F | Resp 16 | Ht 63.0 in | Wt 111.2 lb

## 2018-09-10 DIAGNOSIS — J309 Allergic rhinitis, unspecified: Secondary | ICD-10-CM | POA: Diagnosis not present

## 2018-09-10 DIAGNOSIS — H8103 Meniere's disease, bilateral: Secondary | ICD-10-CM | POA: Diagnosis not present

## 2018-09-10 DIAGNOSIS — F5101 Primary insomnia: Secondary | ICD-10-CM

## 2018-09-10 DIAGNOSIS — Z780 Asymptomatic menopausal state: Secondary | ICD-10-CM | POA: Insufficient documentation

## 2018-09-10 DIAGNOSIS — Z79890 Hormone replacement therapy (postmenopausal): Secondary | ICD-10-CM

## 2018-09-10 DIAGNOSIS — F4323 Adjustment disorder with mixed anxiety and depressed mood: Secondary | ICD-10-CM

## 2018-09-10 DIAGNOSIS — IMO0001 Reserved for inherently not codable concepts without codable children: Secondary | ICD-10-CM

## 2018-09-10 DIAGNOSIS — H9042 Sensorineural hearing loss, unilateral, left ear, with unrestricted hearing on the contralateral side: Secondary | ICD-10-CM

## 2018-09-10 HISTORY — DX: Hormone replacement therapy: Z79.890

## 2018-09-10 NOTE — Progress Notes (Signed)
Subjective  CC:  Chief Complaint  Patient presents with  . Establish Care    Previous PCP was Dr. Wylene Simmer, CPE was 03/2018  . Mouth Lesions    Recurring problem, uses a dental paste.    HPI: Jody Clarke is a 58 y.o. female who presents to [facility] today to establish care with me as a new patient.  I reviewed notes from her gynecologist. She has the following concerns or needs:  Mnire's disease with hearing loss and vertigo: Is a difficult time over the last decade.  In 2014 she had surgery but still wonders if this was the best course of action.  Fortunately over the last 1 to 2 years her symptoms have become more controlled and/or she is managing them better.  She takes hydrochlorothiazide daily.  She follows with ENT annually.  HRT, postmenopausal for quality of life.  No specific menopausal symptoms.  Has been taking them for the last 5 to 6 years.  Overall thinks they are beneficial.  She is a good candidate.  She has no cardiovascular risk.  Mammogram and Pap smear screens are up-to-date.  Anxiety: Tends to be a type a personality.  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  In the past she has taken medications for anxiety including Zoloft.  Currently she uses Valium rarely for anxiety or vertigo symptoms.  She tends to have problems sleeping as well.  This may be related to anxiety lowering.  Valium does help for this as well.  She is very cautious about his use.  She denies history of depression.  She does not think that her anxiety is currently negatively impacting her.  Chronic allergies: Takes allergy medications regularly.  Health maintenance: Up-to-date.  Negative Cologuard for colorectal cancer screening done.  Lives a healthy lifestyle.  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 Post-menopause on HRT (hormone replacement therapy)   3. Chronic allergic rhinitis   4. Adjustment disorder with anxiety   5. Asymmetrical left  sensorineural hearing loss   6. Primary insomnia      Plan   Counseling done regarding treatment plans for chronic medical problems.  Mostly well controlled.  Patient to consider anxiety management if she feels that anxiety is negatively impacting her.  Reassured, currently PRN Valium is a reasonable treatment plan for her vertigo and mild anxiety/insomnia.  HRT: Likely is beneficial.  Recommend continuing until age 26.  She will discuss with her gynecologist.  I will request lab work from her recent CPE from the GYN.  Continue allergy medicines.  Follow up: Complete physical doing December, patient will come in sooner if she like to discuss further HRT or anxiety management plans. No orders of the defined types were placed in this encounter.  No orders of the defined types were placed in this encounter.    Depression screen PHQ 2/9 09/10/2018  Decreased Interest 0  Down, Depressed, Hopeless 0  PHQ - 2 Score 0    We updated and reviewed the patient's past history in detail and it is documented below.  Patient Active Problem List   Diagnosis Date Noted  . Post-menopause on HRT (hormone replacement therapy) 09/10/2018  . Chronic allergic rhinitis 09/10/2018  . Adjustment disorder with anxiety 05/03/2015  . Cochleovestibular active Meniere's disease 05/03/2015  . 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 Insomnia 05/03/2015  . Vitamin D deficiency 05/03/2015  . Deviated nasal septum 05/03/2015   Health  Maintenance  Topic Date Due  . HIV Screening  09/21/1975  . TETANUS/TDAP  09/21/1979  . INFLUENZA VACCINE  11/15/2018  . MAMMOGRAM  01/10/2019  . Fecal DNA (Cologuard)  04/17/2021  . PAP SMEAR-Modifier  09/04/2021  . Hepatitis C Screening  Completed    There is no immunization history on file for this patient. Current Meds  Medication Sig  . azelastine (ASTELIN) 0.1 % nasal spray daily as needed.  . cetirizine (ZYRTEC) 10 MG tablet Take 10 mg by mouth daily.   . Cholecalciferol (VITAMIN D3) 25 MCG (1000 UT) CAPS Take by mouth.  . diazepam (VALIUM) 5 MG tablet Take 5 mg by mouth as needed for anxiety.  Marland Kitchen estradiol (ESTRACE) 0.1 MG/GM vaginal cream Apply small amount topically twice weekly  . estradiol (ESTRACE) 0.5 MG tablet Take 2 tablets (1 mg total) by mouth daily.  . progesterone (PROMETRIUM) 100 MG capsule Take 1 capsule (100 mg total) by mouth daily.  Marland Kitchen triamterene-hydrochlorothiazide (DYAZIDE) 37.5-25 MG per capsule Take 1 capsule by mouth daily.    Allergies: Patient is allergic to sulfa antibiotics and ciprofloxacin. Past Medical History Patient  has a past medical history of Allergy, Anxiety, Meniere's disease, Osteopenia, and Post-menopause on HRT (hormone replacement therapy) (09/10/2018). 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 Family History: Patient family history includes Atrial fibrillation in her father; Cancer in her father; Osteoporosis in her mother. Social History:  Patient  reports that she has never smoked. She has never used smokeless tobacco. She reports current alcohol use. She reports that she does not use drugs.  Review of Systems: Constitutional: negative for fever or malaise Ophthalmic: negative for photophobia, double vision or loss of vision Cardiovascular: negative for chest pain, dyspnea on exertion, or new LE swelling Respiratory: negative for SOB or persistent cough Gastrointestinal: negative for abdominal pain, change in bowel habits or melena Genitourinary: negative for dysuria or gross hematuria Musculoskeletal: negative for new gait disturbance or muscular weakness Integumentary: negative for new or persistent rashes Neurological: negative for TIA or stroke symptoms Psychiatric: negative for SI or delusions  Allergic/Immunologic: negative for hives  Patient Care Team    Relationship Specialty Notifications Start End  Willow Ora, MD PCP - General Family Medicine  09/10/18   Tisovec, Adelfa Koh, MD Consulting Physician Internal Medicine  09/10/18 09/10/18  Jerene Bears, MD Consulting Physician Gynecology  09/10/18   Christia Reading, MD Consulting Physician Otolaryngology  09/10/18     Objective  Vitals: BP 102/64   Pulse 86   Temp 98.9 F (37.2 C) (Oral)   Resp 16   Ht  (1.6 m)   Wt 111 lb 3.2 oz (50.4 kg)   LMP 04/16/2008   SpO2 98%   BMI 19.70 kg/m  General:  Well developed, well nourished, no acute distress  Psych:  Alert and oriented,normal mood and affect HEENT:  Normocephalic, atraumatic, non-icteric sclera, PERRL, oropharynx is without mass or exudate, supple neck without adenopathy, mass or thyromegaly Cardiovascular:  RRR without gallop, rub or murmur, nondisplaced PMI Respiratory:  Good breath sounds bilaterally, CTAB with normal respiratory effort Gastrointestinal: normal bowel sounds, soft, non-tender, no noted masses. No HSM MSK: no deformities, contusions. Joints are without erythema or swelling Skin:  Warm, no rashes or suspicious lesions noted Neurologic:    Mental status is normal. Gross motor and sensory exams  are normal. Normal gait   Commons side effects, risks, benefits, and alternatives for medications and treatment plan prescribed today were discussed, and the patient expressed understanding of the given instructions. Patient is instructed to call or message via MyChart if he/she has any questions or concerns regarding our treatment plan. No barriers to understanding were identified. We discussed Red Flag symptoms and signs in detail. Patient expressed understanding regarding what to do in case of urgent or emergency type symptoms.   Medication list was reconciled, printed and provided to the patient in AVS. Patient instructions and summary information was  reviewed with the patient as documented in the AVS. This note was prepared with assistance of Dragon voice recognition software. Occasional wrong-word or sound-a-like substitutions may have occurred due to the inherent limitations of voice recognition software

## 2018-09-10 NOTE — Patient Instructions (Addendum)
Please return in December 2021 for your complete physical, please come fasting.  It was a pleasure meeting you today! Thank you for choosing [place] to meet your healthcare needs! I truly look forward to working with you. If you have any questions or concerns, please send me a message via Mychart or call the office at [phone].  Let me know if you need anything!

## 2018-09-22 ENCOUNTER — Encounter: Payer: Self-pay | Admitting: Family Medicine

## 2018-09-26 ENCOUNTER — Other Ambulatory Visit: Payer: Self-pay | Admitting: Obstetrics & Gynecology

## 2018-09-26 NOTE — Telephone Encounter (Signed)
Spoke with patient. Patient states refill not needed at this time. States she will call when needs refill. Advised would update pharmacy. Patient agreeable.

## 2019-03-23 ENCOUNTER — Encounter: Payer: Managed Care, Other (non HMO) | Admitting: Family Medicine

## 2019-03-31 ENCOUNTER — Other Ambulatory Visit: Payer: Self-pay | Admitting: Obstetrics & Gynecology

## 2019-03-31 DIAGNOSIS — Z01419 Encounter for gynecological examination (general) (routine) without abnormal findings: Secondary | ICD-10-CM

## 2019-03-31 DIAGNOSIS — N950 Postmenopausal bleeding: Secondary | ICD-10-CM

## 2019-03-31 NOTE — Telephone Encounter (Signed)
Pt requesting RF due to expired RX. Med refill request: Prometrium  Last AEX: 03/21/2018 Next AEX: 07/28/2019  Last MMG (if hormonal med) 01/09/2018, BIRADS 1, negative Refill authorized: Please Advise? #90, 4 RF, orders pended if approved.

## 2019-03-31 NOTE — Telephone Encounter (Signed)
Pt requesting RF due to old Rx expired.   Med refill request: Estrace Last AEX: 03/21/2018 Next AEX: 07/28/2019 Last MMG (if hormonal med) 0926/2019, BIRADS 1, Negative Refill authorized: Please Advise? #180, 1 RF, orders pended if approved.

## 2019-04-17 HISTORY — PX: CATARACT EXTRACTION: SUR2

## 2019-05-25 ENCOUNTER — Other Ambulatory Visit: Payer: Self-pay | Admitting: Obstetrics & Gynecology

## 2019-05-25 DIAGNOSIS — Z1231 Encounter for screening mammogram for malignant neoplasm of breast: Secondary | ICD-10-CM

## 2019-06-11 ENCOUNTER — Ambulatory Visit: Payer: Managed Care, Other (non HMO) | Attending: Internal Medicine

## 2019-06-11 ENCOUNTER — Ambulatory Visit (INDEPENDENT_AMBULATORY_CARE_PROVIDER_SITE_OTHER): Payer: Managed Care, Other (non HMO) | Admitting: Family Medicine

## 2019-06-11 ENCOUNTER — Encounter: Payer: Self-pay | Admitting: Family Medicine

## 2019-06-11 VITALS — Temp 98.6°F | Ht 63.0 in | Wt 111.0 lb

## 2019-06-11 DIAGNOSIS — K121 Other forms of stomatitis: Secondary | ICD-10-CM

## 2019-06-11 DIAGNOSIS — J029 Acute pharyngitis, unspecified: Secondary | ICD-10-CM | POA: Diagnosis not present

## 2019-06-11 DIAGNOSIS — Z20822 Contact with and (suspected) exposure to covid-19: Secondary | ICD-10-CM

## 2019-06-11 DIAGNOSIS — R509 Fever, unspecified: Secondary | ICD-10-CM

## 2019-06-11 NOTE — Progress Notes (Signed)
Virtual Visit via Video Note  Subjective  CC:  Chief Complaint  Patient presents with  . Mouth Lesions    had mouth ulcers for many years on and off. flare up started 3 days ago on tongue and back of throat.  currently on doxycycline for eye irritation. running a low grade fever     I connected with Jody Clarke on 06/11/19 at 10:00 AM EST by a video enabled telemedicine application and verified that I am speaking with the correct person using two identifiers. Location patient: Home Location provider: [facility] [address] Office Persons participating in the virtual visit: Jody Clarke, Jody Ora, MD Jody Clarke, Jody Clarke  I discussed the limitations of evaluation and management by telemedicine and the availability of in person appointments. The patient expressed understanding and agreed to proceed. HPI: Jody Clarke is a 59 y.o. female who was contacted today to address the problems listed above in the chief complaint. . 59 yo reports at least a decade of recurrent mouth ulcers, in the past treated with dental steroid paste. Started with 2 Jody ulcers about 3 days ago: started on left buccal mucosa and tongue. Then developed pain in throat with swallowing. Concerned she has ulcers going down the back of the throat. Also with fatigue and low grade fever to 99.7 yesterday. No myalgias, cough, congestion, sob or loss of taste or smell. No GI sxs. Taking ibuprofen with some relief. She happens to be on doxy for an eye infection. No rash or arthralgias.   Assessment  1. Sore throat   2. Fever, unspecified fever cause   3. Oral ulcer      Plan   Oral ulcers and sore throat:  Will check for covid; suspect viral pharyngitis: treat supportively with gargles, advil, tylenol and work on hydration. IF worsens, would consider ENT eval due to recurrent ulcers.  I discussed the assessment and treatment plan with the patient. The patient was provided an  opportunity to ask questions and all were answered. The patient agreed with the plan and demonstrated an understanding of the instructions.   The patient was advised to call back or seek an in-person evaluation if the symptoms worsen or if the condition fails to improve as anticipated. Follow up: for cpe  07/03/2019  No orders of the defined types were placed in this encounter.     I reviewed the patients updated PMH, FH, and SocHx.    Patient Active Problem List   Diagnosis Date Noted  . Post-menopause on HRT (hormone replacement therapy) 09/10/2018  . Chronic allergic rhinitis 09/10/2018  . Adjustment disorder with anxiety 05/03/2015  . Cochleovestibular active Meniere's disease 05/03/2015  . 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 Insomnia 05/03/2015  . Vitamin D deficiency 05/03/2015  . Deviated nasal septum 05/03/2015   Current Meds  Medication Sig  . azelastine (ASTELIN) 0.1 % nasal spray daily as needed.  . cetirizine (ZYRTEC) 10 MG tablet Take 10 mg by mouth daily.  . Cholecalciferol (VITAMIN D3) 25 MCG (1000 UT) CAPS Take by mouth.  . diazepam (VALIUM) 5 MG tablet Take 5 mg by mouth as needed for anxiety.  Marland Kitchen doxycycline (VIBRA-TABS) 100 MG tablet Take 100 mg by mouth 2 (two) times daily.  Marland Kitchen estradiol (ESTRACE) 0.1 MG/GM vaginal cream Apply small amount topically twice weekly  . estradiol (ESTRACE) 0.5 MG tablet TAKE 2 TABLETS (1 MG TOTAL) BY MOUTH DAILY.  . progesterone (PROMETRIUM)  100 MG capsule TAKE 1 CAPSULE BY MOUTH EVERY DAY  . triamterene-hydrochlorothiazide (DYAZIDE) 37.5-25 MG per capsule Take 1 capsule by mouth daily.    Allergies: Patient is allergic to sulfa antibiotics and ciprofloxacin. Family History: Patient family history includes Atrial fibrillation in her father; Cancer in her father; Osteoporosis in her mother. Social History:  Patient  reports that she has never smoked. She has never used smokeless tobacco. She reports current  alcohol use. She reports that she does not use drugs.  Review of Systems: Constitutional: Negative for fever malaise or anorexia Cardiovascular: negative for chest pain Respiratory: negative for SOB or persistent cough Gastrointestinal: negative for abdominal pain  OBJECTIVE Vitals: Temp 98.6 F (37 C) (Temporal)   Ht 5\' 3"  (1.6 m)   Wt 111 lb (50.3 kg)   LMP 04/16/2008   BMI 19.66 kg/m  General: no acute distress  But has visible discomfort with swallowing Normal speech. No respiratory distress  Leamon Arnt, MD

## 2019-06-12 LAB — NOVEL CORONAVIRUS, NAA: SARS-CoV-2, NAA: NOT DETECTED

## 2019-07-03 ENCOUNTER — Encounter: Payer: Self-pay | Admitting: Family Medicine

## 2019-07-03 ENCOUNTER — Ambulatory Visit
Admission: RE | Admit: 2019-07-03 | Discharge: 2019-07-03 | Disposition: A | Discharge status: Home / Self Care | Payer: Managed Care, Other (non HMO) | Source: Ambulatory Visit | Attending: Obstetrics & Gynecology | Admitting: Obstetrics & Gynecology

## 2019-07-03 ENCOUNTER — Ambulatory Visit (INDEPENDENT_AMBULATORY_CARE_PROVIDER_SITE_OTHER): Payer: Managed Care, Other (non HMO) | Admitting: Family Medicine

## 2019-07-03 ENCOUNTER — Other Ambulatory Visit: Payer: Self-pay

## 2019-07-03 VITALS — BP 110/68 | HR 72 | Temp 97.9°F | Ht 63.0 in | Wt 109.2 lb

## 2019-07-03 DIAGNOSIS — E559 Vitamin D deficiency, unspecified: Secondary | ICD-10-CM | POA: Diagnosis not present

## 2019-07-03 DIAGNOSIS — K120 Recurrent oral aphthae: Secondary | ICD-10-CM

## 2019-07-03 DIAGNOSIS — H8103 Meniere's disease, bilateral: Secondary | ICD-10-CM | POA: Diagnosis not present

## 2019-07-03 DIAGNOSIS — R768 Other specified abnormal immunological findings in serum: Secondary | ICD-10-CM

## 2019-07-03 DIAGNOSIS — Z0000 Encounter for general adult medical examination without abnormal findings: Secondary | ICD-10-CM | POA: Diagnosis not present

## 2019-07-03 DIAGNOSIS — Z79890 Hormone replacement therapy (postmenopausal): Secondary | ICD-10-CM

## 2019-07-03 DIAGNOSIS — Z1231 Encounter for screening mammogram for malignant neoplasm of breast: Secondary | ICD-10-CM

## 2019-07-03 LAB — CBC WITH DIFFERENTIAL/PLATELET
Basophils Absolute: 0 10*3/uL (ref 0.0–0.1)
Basophils Relative: 0.6 % (ref 0.0–3.0)
Eosinophils Absolute: 0.3 10*3/uL (ref 0.0–0.7)
Eosinophils Relative: 8.5 % — ABNORMAL HIGH (ref 0.0–5.0)
HCT: 41.8 % (ref 36.0–46.0)
Hemoglobin: 14.2 g/dL (ref 12.0–15.0)
Lymphocytes Relative: 39.8 % (ref 12.0–46.0)
Lymphs Abs: 1.4 10*3/uL (ref 0.7–4.0)
MCHC: 33.9 g/dL (ref 30.0–36.0)
MCV: 92.5 fl (ref 78.0–100.0)
Monocytes Absolute: 0.4 10*3/uL (ref 0.1–1.0)
Monocytes Relative: 10.2 % (ref 3.0–12.0)
Neutro Abs: 1.4 10*3/uL (ref 1.4–7.7)
Neutrophils Relative %: 40.9 % — ABNORMAL LOW (ref 43.0–77.0)
Platelets: 290 10*3/uL (ref 150.0–400.0)
RBC: 4.52 Mil/uL (ref 3.87–5.11)
RDW: 12.1 % (ref 11.5–15.5)
WBC: 3.5 10*3/uL — ABNORMAL LOW (ref 4.0–10.5)

## 2019-07-03 LAB — LIPID PANEL
Cholesterol: 176 mg/dL (ref 0–200)
HDL: 49.4 mg/dL (ref >39.00)
LDL Cholesterol: 113 mg/dL — ABNORMAL HIGH (ref 0–99)
NonHDL: 126.59
Total CHOL/HDL Ratio: 4
Triglycerides: 69 mg/dL (ref 0.0–149.0)
VLDL: 13.8 mg/dL (ref 0.0–40.0)

## 2019-07-03 LAB — COMPREHENSIVE METABOLIC PANEL
ALT: 12 U/L (ref 0–35)
AST: 16 U/L (ref 0–37)
Albumin: 4.1 g/dL (ref 3.5–5.2)
Alkaline Phosphatase: 46 U/L (ref 39–117)
BUN: 16 mg/dL (ref 6–23)
CO2: 33 mEq/L — ABNORMAL HIGH (ref 19–32)
Calcium: 9.5 mg/dL (ref 8.4–10.5)
Chloride: 98 mEq/L (ref 96–112)
Creatinine, Ser: 0.7 mg/dL (ref 0.40–1.20)
GFR: 85.71 mL/min (ref >60.00)
Glucose, Bld: 85 mg/dL (ref 70–99)
Potassium: 3.9 mEq/L (ref 3.5–5.1)
Sodium: 137 mEq/L (ref 135–145)
Total Bilirubin: 0.5 mg/dL (ref 0.2–1.2)
Total Protein: 6.8 g/dL (ref 6.0–8.3)

## 2019-07-03 LAB — VITAMIN B12: Vitamin B-12: 620 pg/mL (ref 211–911)

## 2019-07-03 LAB — VITAMIN D 25 HYDROXY (VIT D DEFICIENCY, FRACTURES): VITD: 43.9 ng/mL (ref 30.00–100.00)

## 2019-07-03 LAB — SEDIMENTATION RATE: Sed Rate: 9 mm/hr (ref 0–30)

## 2019-07-03 LAB — TSH: TSH: 2.13 u[IU]/mL (ref 0.35–4.50)

## 2019-07-03 NOTE — Patient Instructions (Signed)
Please return in 12 months for your annual complete physical; please come fasting.  I will release your lab results to you on your MyChart account with further instructions. Please reply with any questions.    If you have any questions or concerns, please don't hesitate to send me a message via MyChart or call the office at [phone]. Thank you for visiting with [place] today! It's our pleasure caring for you.   Preventive Care 24-59 Years Old, Female Preventive care refers to visits with your health care provider and lifestyle choices that can promote health and wellness. This includes:  A yearly physical exam. This may also be called an annual well check.  Regular dental visits and eye exams.  Immunizations.  Screening for certain conditions.  Healthy lifestyle choices, such as eating a healthy diet, getting regular exercise, not using drugs or products that contain nicotine and tobacco, and limiting alcohol use. What can I expect for my preventive care visit? Physical exam Your health care provider will check your:  Height and weight. This may be used to calculate body mass index (BMI), which tells if you are at a healthy weight.  Heart rate and blood pressure.  Skin for abnormal spots. Counseling Your health care provider may ask you questions about your:  Alcohol, tobacco, and drug use.  Emotional well-being.  Home and relationship well-being.  Sexual activity.  Eating habits.  Work and work Statistician.  Method of birth control.  Menstrual cycle.  Pregnancy history. What immunizations do I need?  Influenza (flu) vaccine  This is recommended every year. Tetanus, diphtheria, and pertussis (Tdap) vaccine  You may need a Td booster every 10 years. Varicella (chickenpox) vaccine  You may need this if you have not been vaccinated. Zoster (shingles) vaccine  You may need this after age 72. Measles, mumps, and rubella (MMR) vaccine  You may need at least  one dose of MMR if you were born in 1957 or later. You may also need a second dose. Pneumococcal conjugate (PCV13) vaccine  You may need this if you have certain conditions and were not previously vaccinated. Pneumococcal polysaccharide (PPSV23) vaccine  You may need one or two doses if you smoke cigarettes or if you have certain conditions. Meningococcal conjugate (MenACWY) vaccine  You may need this if you have certain conditions. Hepatitis A vaccine  You may need this if you have certain conditions or if you travel or work in places where you may be exposed to hepatitis A. Hepatitis B vaccine  You may need this if you have certain conditions or if you travel or work in places where you may be exposed to hepatitis B. Haemophilus influenzae type b (Hib) vaccine  You may need this if you have certain conditions. Human papillomavirus (HPV) vaccine  If recommended by your health care provider, you may need three doses over 6 months. You may receive vaccines as individual doses or as more than one vaccine together in one shot (combination vaccines). Talk with your health care provider about the risks and benefits of combination vaccines. What tests do I need? Blood tests  Lipid and cholesterol levels. These may be checked every 5 years, or more frequently if you are over 74 years old.  Hepatitis C test.  Hepatitis B test. Screening  Lung cancer screening. You may have this screening every year starting at age 77 if you have a 30-pack-year history of smoking and currently smoke or have quit within the past 15 years.  Colorectal  cancer screening. All adults should have this screening starting at age 37 and continuing until age 58. Your health care provider may recommend screening at age 50 if you are at increased risk. You will have tests every 1-10 years, depending on your results and the type of screening test.  Diabetes screening. This is done by checking your blood sugar (glucose)  after you have not eaten for a while (fasting). You may have this done every 1-3 years.  Mammogram. This may be done every 1-2 years. Talk with your health care provider about when you should start having regular mammograms. This may depend on whether you have a family history of breast cancer.  BRCA-related cancer screening. This may be done if you have a family history of breast, ovarian, tubal, or peritoneal cancers.  Pelvic exam and Pap test. This may be done every 3 years starting at age 19. Starting at age 40, this may be done every 5 years if you have a Pap test in combination with an HPV test. Other tests  Sexually transmitted disease (STD) testing.  Bone density scan. This is done to screen for osteoporosis. You may have this scan if you are at high risk for osteoporosis. Follow these instructions at home: Eating and drinking  Eat a diet that includes fresh fruits and vegetables, whole grains, lean protein, and low-fat dairy.  Take vitamin and mineral supplements as recommended by your health care provider.  Do not drink alcohol if: ? Your health care provider tells you not to drink. ? You are pregnant, may be pregnant, or are planning to become pregnant.  If you drink alcohol: ? Limit how much you have to 0-1 drink a day. ? Be aware of how much alcohol is in your drink. In the [place] one drink equals one 12 oz bottle of beer (355 mL), one 5 oz glass of wine (148 mL), or one 1 oz glass of hard liquor (44 mL). Lifestyle  Take daily care of your teeth and gums.  Stay active. Exercise for at least 30 minutes on 5 or more days each week.  Do not use any products that contain nicotine or tobacco, such as cigarettes, e-cigarettes, and chewing tobacco. If you need help quitting, ask your health care provider.  If you are sexually active, practice safe sex. Use a condom or other form of birth control (contraception) in order to prevent pregnancy and STIs (sexually transmitted  infections).  If told by your health care provider, take low-dose aspirin daily starting at age 88. What's next?  Visit your health care provider once a year for a well check visit.  Ask your health care provider how often you should have your eyes and teeth checked.  Stay up to date on all vaccines. This information is not intended to replace advice given to you by your health care provider. Make sure you discuss any questions you have with your health care provider. Document Revised: 12/12/2017 Document Reviewed: 12/12/2017 Elsevier Patient Education  2020 [place].   Oral Ulcers Oral ulcers are small sores inside the mouth or near the mouth. They may occur on or inside the lips, inside the cheeks, on the tongue, or anywhere else inside or near the mouth. They may be called canker sores or cold sores, which are two types of oral ulcers. Many oral ulcers are harmless and go away on their own. In some cases, oral ulcers may require medical care to determine the cause and proper treatment. What are  the causes? Common causes of this condition include:  Infections caused by viruses, bacteria, or fungi.  Emotional stress.  Foods or chemicals that irritate the mouth.  Injury or physical irritation of the mouth.  Medicines.  Allergies.  Tobacco use. Less common causes include:  Skin disease.  A type of herpes virus infection (herpes simplexor herpes zoster).  Oral cancer. In some cases, the cause may not be known. What increases the risk? You are more likely to develop this condition if:  You wear dental braces, dentures, or retainers.  You have poor oral hygiene.  You have sensitive skin.  You have a condition that affects the entire body (systemic condition), such as an immune disorder. What are the signs or symptoms? The main symptom of this condition is having one or more oval-shaped or round ulcers that have red borders. Symptoms may vary depending on the  cause. This includes:  Location of the ulcers. Ulcers may be found inside the mouth, on the gums, or on the insides of the lips or cheeks. They may also be found on the lips or on skin that is near the mouth, such as the cheeks or chin.  Pain. Ulcers can be painful and uncomfortable, or they can be painless.  Appearance of the ulcers. They may look like red blisters and be filled with fluid, or they may be white or yellow patches.  Frequency of outbreaks. Ulcers may go away permanently after one outbreak, or they may come back (recur) often or rarely. How is this diagnosed? This condition is diagnosed with a physical exam. Your health care provider may ask you questions about your lifestyle and your medical history. You may have tests, including:  Blood tests.  Removal of a small number of cells from an ulcer to be examined under a microscope (biopsy). How is this treated? Treatment depends on the severity and cause of the condition. Oral ulcers often go away on their own in 1-2 weeks. Treatment may include medicines, such as:  Medicines to treat a viral infection (antivirals), a bacterial infection (antibiotics), or a fungal infection (antifungals).  Medicines to help control pain. This may include: ? Over-the-counter pain medicines. ? Gel, cream, or spray to numb the area (topical anesthetic) if you have severe pain. ? Other medicines to coat or numb your mouth. Follow these instructions at home: Medicines  Take or use over-the-counter and prescription medicines only as told by your health care provider.  If you were prescribed an antibiotic medicine, take it as told by your health care provider. Do not stop taking the antibiotic even if you start to feel better.  Do not use products that contain benzocaine (including numbing gels) to treat teething or mouth pain in children who are younger than 2 years. These products may cause a rare but serious blood condition. Eating and  drinking  Eat a balanced diet. Do not eat: ? Spicy foods. ? Citrus, such as oranges. ? Other foods and drinks that you think may cause or irritate your ulcers.  Drink enough fluid to keep your urine pale yellow.  Avoid drinking alcohol. Lifestyle   Practice good oral hygiene: ? Gently brush your teeth with a soft toothbrush two times a day. ? Floss your teeth every day. ? Get regular dental cleanings and checkups.  Do not use any products that contain nicotine or tobacco, such as cigarettes and e-cigarettes. If you need help quitting, ask your health care provider. Managing pain  If directed, put ice  on your face in the affected area to help reduce pain. ? Put ice in a plastic bag. ? Place a towel between your skin and the bag. ? Leave the ice on for 20 minutes, 2-3 times a day.  Avoid physical or chemical irritants that may have caused the ulcers or made them worse, such as mouthwashes that contain alcohol (ethanol). If you wear dental braces, dentures, or retainers, work with your health care provider to make sure these devices are fitted correctly.  If you were prescribed a prescription mouthwash to help reduce pain in your mouth, use it as told by your health care provider. General instructions  Rinse with a salt-water mixture 3-4 times a day or as told by your health care provider. To make a salt-water mixture, completely dissolve -1 tsp (3-6 g) of salt in 1 cup (237 mL) of warm water.  Keep all follow-up visits as told by your health care provider. This is important. Contact a health care provider if:  You have: ? Pain that gets worse or does not get better with medicine. ? Four or more ulcers at one time. ? A fever. ? New ulcers that look or feel different from other ulcers you have. ? Inflammation in one eye or both eyes. ? Ulcers that do not go away after 10 days.  You develop new symptoms in your mouth, such as: ? Bleeding or crusting around your lips or  gums. ? Tooth pain. ? Difficulty swallowing.  You develop symptoms on your skin or genitals, such as: ? A rash or blisters. ? Burning or itching sensations.  Your ulcers begin or get worse after you start a new medicine. Get help right away if you have:  Difficulty breathing.  Swelling in your face or neck.  Excessive bleeding from your mouth.  Severe pain. Summary  Oral ulcers may occur anywhere inside or near the mouth.  They can be caused by many things, such as infections, stress, injury or irritation, or tobacco use.  Oral ulcers can be painful or painless.  Treatment may include medicines to relieve pain or to treat an infection (if appropriate).  Most oral ulcers go away in 1-2 weeks. This information is not intended to replace advice given to you by your health care provider. Make sure you discuss any questions you have with your health care provider. Document Revised: 08/15/2017 Document Reviewed: 08/15/2017 Elsevier Patient Education  [place].

## 2019-07-03 NOTE — Progress Notes (Signed)
Subjective  Chief Complaint  Patient presents with  . Annual Exam    fasting. wants to discuss mouth ulcers    HPI: Jody Clarke is a 59 y.o. female who presents to [facility] [place] today for a Female Wellness Visit. She also has the concerns and/or needs as listed above in the chief complaint. These will be addressed in addition to the Health Maintenance Visit.   Wellness Visit: annual visit with health maintenance review and exam without Pap   HM: sees Dr. Valentina Shaggy; female wellness next due in may. Screens up to date. On HRT. Healthy lifestyle Chronic disease f/u and/or acute problem visit: (deemed necessary to be done in addition to the wellness visit):  Meniere's: stable on meds  Recurrent apthous ulcers x 10 years; at least monthly. Can't find a pattern to them - hasn't identified food sensitivies, stress induced or other. Uses steroid dental paste which helps. Never has had eval for associated conditions. Denies rashes, arthralgias, blood dyscrasias, dry eyes. Eats a healthy diet.   Assessment  1. Annual physical exam   2. Active cochleovestibular Meniere's disease of both ears   3. Post-menopause on HRT (hormone replacement therapy)   4. Vitamin D deficiency   5. Recurrent aphthous ulcer      Plan  Female Wellness Visit:  Age appropriate Health Maintenance and Prevention measures were discussed with patient. Included topics are cancer screening recommendations, ways to keep healthy (see AVS) including dietary and exercise recommendations, regular eye and dental care, use of seat belts, and avoidance of moderate alcohol use and tobacco use.   BMI: discussed patient's BMI and encouraged positive lifestyle modifications to help get to or maintain a target BMI.  HM needs and immunizations were addressed and ordered. See below for orders. See HM and immunization section for updates. Pt to get covid vaccine #1 today. Defer tdap and influenza  accordingly.  Routine labs and screening tests ordered including cmp, cbc and lipids where appropriate.  Discussed recommendations regarding Vit D and calcium supplementation (see AVS)  Chronic disease management visit and/or acute problem visit:  Start eval for recurrent ulcers. Consider ent eval if active for possible biopsy.   Follow up: No follow-ups on file.  Orders Placed This Encounter  Procedures  . CBC with Differential/Platelet  . Comprehensive metabolic panel  . Lipid panel  . TSH  . Vitamin B12  . VITAMIN D 25 Hydroxy (Vit-D Deficiency, Fractures)  . Antinuclear Antib (ANA)  . Folate RBC  . Sedimentation rate   No orders of the defined types were placed in this encounter.     Lifestyle: Body mass index is 19.34 kg/m. Wt Readings from Last 3 Encounters:  07/03/19 109 lb 3.2 oz (49.5 kg)  06/11/19 111 lb (50.3 kg)  09/10/18 111 lb 3.2 oz (50.4 kg)     Patient Active Problem List   Diagnosis Date Noted  . Recurrent aphthous ulcer 07/03/2019  . Post-menopause on HRT (hormone replacement therapy) 09/10/2018  . Chronic allergic rhinitis 09/10/2018  . Adjustment disorder with anxiety 05/03/2015  . Cochleovestibular active Meniere's disease 05/03/2015  . 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 Insomnia 05/03/2015  . Vitamin D deficiency 05/03/2015  . Deviated nasal septum 05/03/2015   Health Maintenance  Topic Date Due  . MAMMOGRAM  01/10/2019  . INFLUENZA VACCINE  07/15/2019 (Originally 11/15/2018)  . TETANUS/TDAP  07/24/2019 (Originally 09/21/1979)  . Fecal DNA (Cologuard)  04/17/2021  . PAP SMEAR-Modifier  03/22/2023  . Hepatitis C Screening  Completed  . HIV Screening  Discontinued    There is no immunization history on file for this patient. We updated and reviewed the patient's past history in detail and it is documented below. Allergies: Patient is allergic to sulfa antibiotics and ciprofloxacin. Past Medical History Patient   has a past medical history of Allergy, Anxiety, Meniere's disease, Osteopenia, and Post-menopause on HRT (hormone replacement therapy) (09/10/2018). 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 Family History: Patient family history includes Atrial fibrillation in her father; Cancer in her father; Osteoporosis in her mother. Social History:  Patient  reports that she has never smoked. She has never used smokeless tobacco. She reports current alcohol use. She reports that she does not use drugs.  Review of Systems: Constitutional: negative for fever or malaise Ophthalmic: negative for photophobia, double vision or loss of vision Cardiovascular: negative for chest pain, dyspnea on exertion, or new LE swelling Respiratory: negative for SOB or persistent cough Gastrointestinal: negative for abdominal pain, change in bowel habits or melena Genitourinary: negative for dysuria or gross hematuria, no abnormal uterine bleeding or disharge Musculoskeletal: negative for new gait disturbance or muscular weakness Integumentary: negative for new or persistent rashes, no breast lumps Neurological: negative for TIA or stroke symptoms Psychiatric: negative for SI or delusions Allergic/Immunologic: negative for hives  Patient Care Team    Relationship Specialty Notifications Start End  Leamon Arnt, MD PCP - General Family Medicine  09/10/18   Megan Salon, MD Consulting Physician Gynecology  09/10/18   Melida Quitter, MD Consulting Physician Otolaryngology  09/10/18   Rolm Bookbinder, MD Consulting Physician Dermatology  10/09/18   Megan Salon, MD Consulting Physician Gynecology  10/09/18     Objective  Vitals: BP 110/68 (BP Location: Left Arm, Patient Position: Sitting, Cuff Size: Normal)   Pulse 72   Temp 97.9 F (36.6  C) (Temporal)   Ht 5\' 3"  (1.6 m)   Wt 109 lb 3.2 oz (49.5 kg)   LMP 04/16/2008   SpO2 98%   BMI 19.34 kg/m  General:  Well developed, well nourished, no acute distress  Psych:  Alert and orientedx3,normal mood and affect HEENT:  Normocephalic, atraumatic, non-icteric sclera,  supple neck without adenopathy, mass or thyromegaly Cardiovascular:  Normal S1, S2, RRR without gallop, rub or murmur Respiratory:  Good breath sounds bilaterally, CTAB with normal respiratory effort Gastrointestinal: normal bowel sounds, soft, non-tender, no noted masses. No HSM MSK: no deformities, contusions. Joints are without erythema or swelling.  Skin:  Warm, no rashes or suspicious lesions noted Neurologic:    Mental status is normal. CN 2-11 are normal. Gross motor and sensory exams are normal. Normal gait. No tremor   Commons side effects, risks, benefits, and alternatives for medications and treatment plan prescribed today were discussed, and the patient expressed understanding of the given instructions. Patient is instructed to call or message via MyChart if he/she has any questions or concerns regarding our treatment plan. No barriers to understanding were identified. We discussed Red Flag symptoms and signs in detail. Patient expressed understanding regarding what to do in case of urgent or emergency type symptoms.   Medication list was reconciled, printed and provided to the patient in AVS. Patient instructions and summary information was reviewed with the patient as documented in the AVS. This note was  prepared with assistance of Conservation officer, historic buildings. Occasional wrong-word or sound-a-like substitutions may have occurred due to the inherent limitations of voice recognition software  This visit occurred during the SARS-CoV-2 public health emergency.  Safety protocols were in place, including screening questions prior to the visit, additional usage of staff PPE, and extensive cleaning of exam  room while observing appropriate contact time as indicated for disinfecting solutions.

## 2019-07-05 LAB — ANTI-NUCLEAR AB-TITER (ANA TITER): ANA Titer 1: 1:160 {titer} — ABNORMAL HIGH

## 2019-07-05 LAB — FOLATE RBC: RBC Folate: 698 ng/mL RBC (ref >280)

## 2019-07-05 LAB — ANA: Anti Nuclear Antibody (ANA): POSITIVE — AB

## 2019-07-06 NOTE — Addendum Note (Signed)
Addended by: Asencion Partridge on: 07/06/2019 10:38 AM   Modules accepted: Orders

## 2019-07-06 NOTE — Progress Notes (Signed)
Would you be able to add on the labs I ordered: anti-dsDNA, sjogern's antibodies and rh factore? If not, please call pt and let her know her blood test results look good, but she did have a + ANA which can be a elevated in healthy patients but also could be a marker for autoimmune disease so I would like to get a few more tests.   Thanks, Dr. Mardelle Matte   The 10-year ASCVD risk score Denman George DC Montez Hageman., et al., 2013) is: 1.9%   Values used to calculate the score:     Age: 59 years     Sex: Female     Is Non-Hispanic African American: No     Diabetic: No     Tobacco smoker: No     Systolic Blood Pressure: 110 mmHg     Is BP treated: No     HDL Cholesterol: 49.4 mg/dL     Total Cholesterol: 176 mg/dL

## 2019-07-07 ENCOUNTER — Other Ambulatory Visit: Payer: Self-pay

## 2019-07-07 ENCOUNTER — Other Ambulatory Visit: Payer: Managed Care, Other (non HMO)

## 2019-07-07 DIAGNOSIS — K120 Recurrent oral aphthae: Secondary | ICD-10-CM

## 2019-07-07 DIAGNOSIS — R768 Other specified abnormal immunological findings in serum: Secondary | ICD-10-CM

## 2019-07-08 LAB — SJOGREN'S SYNDROME ANTIBODS(SSA + SSB)
SSA (Ro) (ENA) Antibody, IgG: <1 AI
SSB (La) (ENA) Antibody, IgG: <1 AI

## 2019-07-08 LAB — ANTI-DNA ANTIBODY, DOUBLE-STRANDED: ds DNA Ab: 2 IU/mL

## 2019-07-08 LAB — RHEUMATOID FACTOR: Rhuematoid fact SerPl-aCnc: <14 IU/mL (ref <14)

## 2019-07-10 ENCOUNTER — Encounter: Payer: Self-pay | Admitting: Family Medicine

## 2019-07-10 DIAGNOSIS — R768 Other specified abnormal immunological findings in serum: Secondary | ICD-10-CM

## 2019-07-10 DIAGNOSIS — K120 Recurrent oral aphthae: Secondary | ICD-10-CM

## 2019-07-10 NOTE — Telephone Encounter (Signed)
Pleased advised  

## 2019-07-27 ENCOUNTER — Other Ambulatory Visit: Payer: Self-pay

## 2019-07-27 NOTE — Progress Notes (Signed)
59 y.o. G2P0000 Married White or Caucasian female here for annual exam.  Had cataract removal last week and will have the second in 4 weeks.    Patient has appointment with Milagros Loll in May for elevated ANA and other issues.  Patient trying to wean off HRT.  Is now taking 1/2 tab 0.5mg  daily and Prometrium daily.    Hasn't seen Dr. Jenne Pane in several months.  She does need RF on Valium which she uses only when has significant vertigo episode.  Also, uses xanax occasionally for anxiety.  Needs RF.  Uses rarely.    Patient's last menstrual period was 04/16/2008.          Sexually active: Yes.    The current method of family planning is post menopausal status.    Exercising: Yes.    walking and light weights Smoker:  no  Health Maintenance: Pap: 09-04-16 Neg:Neg HR HPV, 06-15-14 Neg  History of abnormal Pap:  no MMG: 07-03-19 3D/Neg/density C/BiRads1 Colonoscopy: never but 04-17-18 Neg cologuard BMD: 01-09-18 Osteopenia  TDaP:  More than 10 years--2nd COVID vaccine today Pneumonia vaccine(s):  no Shingrix:  no Hep C testing: 08-30-15 Neg Screening Labs: done 07/03/2019 with Dr. Mardelle Matte   reports that she has never smoked. She has never used smokeless tobacco. She reports current alcohol use. She reports that she does not use drugs.  Past Medical History:  Diagnosis Date  . Allergy    Allergic rhinitis  . Anxiety   . Meniere's disease   . Osteopenia   . Post-menopause on HRT (hormone replacement therapy) 09/10/2018    Past Surgical History:  Procedure Laterality Date  . CATARACT EXTRACTION Left 2021  . ENDOLYMPHATIC SHUNT DECOMPRESSION  8/14   in [place]  . inner ear endolymphatic sac operation with shunt Left 2014  . KNEE ARTHROSCOPY     right  . KNEE SURGERY    . TONSILLECTOMY      Current Outpatient Medications  Medication Sig Dispense Refill  . azelastine (ASTELIN) 0.1 % nasal spray daily as needed.  5  . cetirizine (ZYRTEC) 10 MG tablet Take 10 mg by mouth daily.     . Cholecalciferol (VITAMIN D3) 25 MCG (1000 UT) CAPS Take by mouth.    . diazepam (VALIUM) 5 MG tablet Take 5 mg by mouth as needed for anxiety.    Marland Kitchen estradiol (ESTRACE) 0.1 MG/GM vaginal cream Apply small amount topically twice weekly 42.5 g 6  . estradiol (ESTRACE) 0.5 MG tablet TAKE 2 TABLETS (1 MG TOTAL) BY MOUTH DAILY. (Patient taking differently: Take 0.5 mg by mouth daily. Pt takes half of the 0.5 mg tablet) 180 tablet 1  . moxifloxacin (VIGAMOX) 0.5 % ophthalmic solution Place 1 drop into the left eye 4 (four) times daily.    . prednisoLONE acetate (PRED FORTE) 1 % ophthalmic suspension Place 1 drop into the left eye 4 (four) times daily.    . progesterone (PROMETRIUM) 100 MG capsule TAKE 1 CAPSULE BY MOUTH EVERY DAY 90 capsule 1  . triamterene-hydrochlorothiazide (DYAZIDE) 37.5-25 MG per capsule Take 1 capsule by mouth daily.     No current facility-administered medications for this visit.    Family History  Problem Relation Age of Onset  . Cancer Father   . Atrial fibrillation Father   . Osteoporosis Mother   . Breast cancer Neg Hx     Review of Systems  All other systems reviewed and are negative.   Exam:   BP 112/70  Pulse 68   Temp (!) 97.2 F (36.2 C) (Temporal)   Resp 18   Ht 5' 2.5" (1.588 m)   Wt 109 lb (49.4 kg)   LMP 04/16/2008   BMI 19.62 kg/m    Height: 5' 2.5" (158.8 cm)  Ht Readings from Last 3 Encounters:  07/28/19 5' 2.5" (1.588 m)  07/03/19 5\' 3"  (1.6 m)  06/11/19 5\' 3"  (1.6 m)    General appearance: alert, cooperative and appears stated age Head: Normocephalic, without obvious abnormality, atraumatic Neck: no adenopathy, supple, symmetrical, trachea midline and thyroid normal to inspection and palpation Lungs: clear to auscultation bilaterally Breasts: normal appearance, no masses or tenderness Heart: regular rate and rhythm Abdomen: soft, non-tender; bowel sounds normal; no masses,  no organomegaly Extremities: extremities normal,  atraumatic, no cyanosis or edema Skin: Skin color, texture, turgor normal. No rashes or lesions Lymph nodes: Cervical, supraclavicular, and axillary nodes normal. No abnormal inguinal nodes palpated Neurologic: Grossly normal   Pelvic: External genitalia:  no lesions              Urethra:  normal appearing urethra with no masses, tenderness or lesions              Bartholins and Skenes: normal                 Vagina: normal appearing vagina with normal color and discharge, no lesions              Cervix: no lesions              Pap taken: Yes.   Bimanual Exam:  Uterus:  normal size, contour, position, consistency, mobility, non-tender              Adnexa: normal adnexa and no mass, fullness, tenderness               Rectovaginal: Confirms               Anus:  normal sphincter tone, no lesions  Chaperone, Marisa Sprinkles, CMA, was present for exam.  A:  Well Woman with normal exam PMP, no HRT Meniere's disease Anxiety Osteopenia  P:   Mammogram guidelines reviewed.   pap smear with HR HPV obtained today RF for estradiol 0.5mg , taking 1/2 tab daily and prometrium 100mg  daily.  90 day supply with RFs given RF for clonazepam 5mg  1 tab bid for severe dizziness with meniere's disease.  Uses rarely.  #30/0RF.  Rx printed for pt.  Pt is only going to get filled if actually needs it.  Nervous about not having rx. Rx for xanax 0.5 tabs tid prn anxiety, dizziness.  #30/1.  Pt knows not to use this with clonazepam.   Cologuard neg 2020.  Repeat 2023. return annually or prn

## 2019-07-28 ENCOUNTER — Ambulatory Visit (INDEPENDENT_AMBULATORY_CARE_PROVIDER_SITE_OTHER): Payer: Managed Care, Other (non HMO) | Admitting: Obstetrics & Gynecology

## 2019-07-28 ENCOUNTER — Encounter: Payer: Self-pay | Admitting: Obstetrics & Gynecology

## 2019-07-28 ENCOUNTER — Other Ambulatory Visit (HOSPITAL_COMMUNITY)
Admission: RE | Admit: 2019-07-28 | Discharge: 2019-07-28 | Disposition: A | Discharge status: Home / Self Care | Payer: Managed Care, Other (non HMO) | Source: Ambulatory Visit | Attending: Obstetrics & Gynecology | Admitting: Obstetrics & Gynecology

## 2019-07-28 VITALS — BP 112/70 | HR 68 | Temp 97.2°F | Resp 18 | Ht 62.5 in | Wt 109.0 lb

## 2019-07-28 DIAGNOSIS — Z124 Encounter for screening for malignant neoplasm of cervix: Secondary | ICD-10-CM | POA: Insufficient documentation

## 2019-07-28 DIAGNOSIS — Z01419 Encounter for gynecological examination (general) (routine) without abnormal findings: Secondary | ICD-10-CM | POA: Diagnosis not present

## 2019-07-28 MED ORDER — PROGESTERONE MICRONIZED 100 MG PO CAPS: 100.0000 mg | ORAL_CAPSULE | Freq: Every day | ORAL | 4 refills | Status: DC

## 2019-07-28 MED ORDER — DIAZEPAM 5 MG PO TABS: 5.0000 mg | ORAL_TABLET | Freq: Two times a day (BID) | ORAL | 0 refills | Status: DC | PRN

## 2019-07-28 MED ORDER — ESTRADIOL 0.5 MG PO TABS: ORAL_TABLET | ORAL | 4 refills | Status: DC

## 2019-07-28 MED ORDER — ALPRAZOLAM 0.5 MG PO TABS: 0.5000 mg | ORAL_TABLET | Freq: Three times a day (TID) | ORAL | 1 refills | Status: DC | PRN

## 2019-07-30 LAB — CYTOLOGY - PAP
Comment: NEGATIVE
Diagnosis: NEGATIVE
High risk HPV: NEGATIVE

## 2019-08-19 ENCOUNTER — Encounter: Payer: Self-pay | Admitting: Family Medicine

## 2019-08-19 ENCOUNTER — Telehealth: Payer: Self-pay

## 2019-08-19 ENCOUNTER — Other Ambulatory Visit: Payer: Self-pay

## 2019-08-19 ENCOUNTER — Ambulatory Visit (INDEPENDENT_AMBULATORY_CARE_PROVIDER_SITE_OTHER): Payer: Managed Care, Other (non HMO) | Admitting: Family Medicine

## 2019-08-19 VITALS — BP 110/72 | HR 92 | Temp 97.9°F | Resp 15 | Ht 63.0 in | Wt 109.4 lb

## 2019-08-19 DIAGNOSIS — M7022 Olecranon bursitis, left elbow: Secondary | ICD-10-CM | POA: Diagnosis not present

## 2019-08-19 MED ORDER — TRIAMCINOLONE ACETONIDE 40 MG/ML IJ SUSP
20.0000 mg | Freq: Once | INTRAMUSCULAR | Status: AC
  Administered 2019-08-19: 20 mg via INTRA_ARTICULAR

## 2019-08-19 MED ORDER — LIDOCAINE HCL (PF) 1 % IJ SOLN
2.0000 mL | Freq: Once | INTRAMUSCULAR | Status: AC
  Administered 2019-08-19: 2 mL

## 2019-08-19 NOTE — Patient Instructions (Signed)
Please follow up if symptoms do not improve or as needed.   Please wear the bandage for 24-72 hours.  Rest the left arm for about a week.

## 2019-08-19 NOTE — Telephone Encounter (Signed)
Spoke with patient, experiencing bursitis of her elbow. Patient is aware to come in at 3 today for same day appt. Please schedule patient.

## 2019-08-19 NOTE — Progress Notes (Signed)
Subjective  CC:  Chief Complaint  Patient presents with  . Bursitis    left elbow, swelling started a month ago, states injury possibly happened 3-4 months ago    HPI: Jody Clarke is a 59 y.o. female who presents to the office today to address the problems listed above in the chief complaint.  As above, noted painless swelling over left elbow months ago. She tends to play with her dog with her left arm and they can be tugging and a little rough. She denies an injury to the elbow or trauma. She is not a leaner. Over the last several weeks, the swelling persists and she has a bit more tenderness over the elbow. No joint pain. No loss of function. No other joint pain. No h/o same. No h/o gout or OA. No fevers.    Assessment  1. Olecranon bursitis of left elbow      Plan   Olecranon bursitis left:  Educated on diagnosis and prognosis. S/p aspiration: send small amount of fluid for cell count and crystal analysis. Not enough for culture. No signs of infection. S/p steroid injection. Leave pressure bandage in place for 24-72 hours. Hopefully will resolve.   Follow up: Return if symptoms worsen or fail to improve.  Visit date not found   No orders of the defined types were placed in this encounter.  No orders of the defined types were placed in this encounter.     I reviewed the patients updated PMH, FH, and SocHx.    Patient Active Problem List   Diagnosis Date Noted  . Cochleovestibular active Meniere's disease 05/03/2015    Priority: High  . Insomnia 05/03/2015    Priority: High  . Recurrent aphthous ulcer 07/03/2019    Priority: Medium  . Post-menopause on HRT (hormone replacement therapy) 09/10/2018    Priority: Medium  . Adjustment disorder with anxiety 05/03/2015    Priority: Medium  . Asymmetrical left sensorineural hearing loss 05/03/2015    Priority: Medium  . Chronic allergic rhinitis 09/10/2018    Priority: Low  . Vitamin D deficiency 05/03/2015   Priority: Low  . Deviated nasal septum 05/03/2015    Priority: Low   Current Meds  Medication Sig  . ALPRAZolam (XANAX) 0.5 MG tablet Take 1 tablet (0.5 mg total) by mouth 3 (three) times daily as needed. Take for vertigo or anxiety.  Marland Kitchen azelastine (ASTELIN) 0.1 % nasal spray daily as needed.  . cetirizine (ZYRTEC) 10 MG tablet Take 10 mg by mouth daily.  . Cholecalciferol (VITAMIN D3) 25 MCG (1000 UT) CAPS Take by mouth.  . diazepam (VALIUM) 5 MG tablet Take 1 tablet (5 mg total) by mouth every 12 (twelve) hours as needed (vertigo).  Marland Kitchen estradiol (ESTRACE) 0.1 MG/GM vaginal cream Apply small amount topically twice weekly  . estradiol (ESTRACE) 0.5 MG tablet 1/2 tab daily  . moxifloxacin (VIGAMOX) 0.5 % ophthalmic solution Place 1 drop into the left eye 4 (four) times daily.  . prednisoLONE acetate (PRED FORTE) 1 % ophthalmic suspension Place 1 drop into the left eye 4 (four) times daily.  . progesterone (PROMETRIUM) 100 MG capsule TAKE 1 CAPSULE BY MOUTH EVERY DAY  . triamterene-hydrochlorothiazide (DYAZIDE) 37.5-25 MG per capsule Take 1 capsule by mouth daily.    Allergies: Patient is allergic to sulfa antibiotics and ciprofloxacin. Family History: Patient family history includes Atrial fibrillation in her father; Cancer in her father; Osteoporosis in her mother. Social History:  Patient  reports that she has  never smoked. She has never used smokeless tobacco. She reports current alcohol use. She reports that she does not use drugs.  Review of Systems: Constitutional: Negative for fever malaise or anorexia Cardiovascular: negative for chest pain Respiratory: negative for SOB or persistent cough Gastrointestinal: negative for abdominal pain  Objective  Vitals: BP 110/72   Pulse 92   Temp 97.9 F (36.6 C) (Temporal)   Resp 15   Ht 5\' 3"  (1.6 m)   Wt 109 lb 6.4 oz (49.6 kg)   LMP 04/16/2008   SpO2 99%   BMI 19.38 kg/m  General: no acute distress , A&Ox3 Left elbow with  obvious swelling over olecranon bursa; minimal redness. No warmth. Mild ttp. FROM of elbow w/o pain.   Olecranon bursa Aspiration and Injection Procedure Note  Pre-operative Diagnosis: left Olecranon bursitis  Post-operative Diagnosis: same  Indications: symptom relief and resolution of bursitis  Anesthesia:cold spray  Procedure Details   Verbal consent was obtained for the procedure. The olecranon was swollen and tender: the overlying skin was prepped with alcohol and cold spray was used to anesthetize the skin. A 25 ga 1.5 inch needle was advanced into the bursa w/o difficulty: 1.5 ml of old bloody fluid was removed and sent to the lab for analysis. After the fluid was removed, 0.5 ml of triamcinolone (KENALOG) 40mg /ml was injected into the bursa along with 1.77ml of lidocaine 1% w/o epi.   A pressure dressing was applied.  Complications:  None; patient tolerated the procedure well.    Commons side effects, risks, benefits, and alternatives for medications and treatment plan prescribed today were discussed, and the patient expressed understanding of the given instructions. Patient is instructed to call or message via MyChart if he/she has any questions or concerns regarding our treatment plan. No barriers to understanding were identified. We discussed Red Flag symptoms and signs in detail. Patient expressed understanding regarding what to do in case of urgent or emergency type symptoms.   Medication list was reconciled, printed and provided to the patient in AVS. Patient instructions and summary information was reviewed with the patient as documented in the AVS. This note was prepared with assistance of Dragon voice recognition software. Occasional wrong-word or sound-a-like substitutions may have occurred due to the inherent limitations of voice recognition software  This visit occurred during the SARS-CoV-2 public health emergency.  Safety protocols were in place, including screening  questions prior to the visit, additional usage of staff PPE, and extensive cleaning of exam room while observing appropriate contact time as indicated for disinfecting solutions.

## 2019-08-19 NOTE — Telephone Encounter (Signed)
Patient states that she have a spot on her elbow looks like its swelling just a little. Patient states that it do not hurt or anything. Patient would like to ask the nurse what OTC medication can she use for this.

## 2019-08-19 NOTE — Telephone Encounter (Signed)
Patient has been scheduled for this afternoon.  °

## 2019-08-20 LAB — SYNOVIAL CELL COUNT + DIFF, W/ CRYSTALS
Basophils, %: 0 %
Eosinophils-Synovial: 0 % (ref 0–2)
Lymphocytes-Synovial Fld: 43 % (ref 0–74)
Monocyte/Macrophage: 41 % (ref 0–69)
Neutrophil, Synovial: 15 % (ref 0–24)
Synoviocytes, %: 0 % (ref 0–15)
WBC, Synovial: 1600 cells/uL — ABNORMAL HIGH (ref <150)

## 2019-12-23 ENCOUNTER — Encounter: Payer: Self-pay | Admitting: Physician Assistant

## 2019-12-23 ENCOUNTER — Telehealth (INDEPENDENT_AMBULATORY_CARE_PROVIDER_SITE_OTHER): Payer: Managed Care, Other (non HMO) | Admitting: Physician Assistant

## 2019-12-23 VITALS — Temp 98.3°F | Ht 63.0 in | Wt 106.0 lb

## 2019-12-23 DIAGNOSIS — R0981 Nasal congestion: Secondary | ICD-10-CM | POA: Diagnosis not present

## 2019-12-23 MED ORDER — PREDNISONE 20 MG PO TABS: 40.0000 mg | ORAL_TABLET | Freq: Every day | ORAL | 0 refills | Status: DC

## 2019-12-23 NOTE — Progress Notes (Signed)
Virtual Visit via Video   I connected with Jody Clarke on 12/23/19 at  9:30 AM EDT by a video enabled telemedicine application and verified that I am speaking with the correct person using two identifiers. Location patient: Home Location provider: [facility], Office Persons participating in the virtual visit: Jody Clarke, [place] PA-C, Jody Mull, LPN   I discussed the limitations of evaluation and management by telemedicine and the availability of in person appointments. The patient expressed understanding and agreed to proceed.  I acted as a Neurosurgeon for [employer], PA-C [employer], LPN   Subjective:   HPI:   Sinus problem Symptoms started on Thursday of last week. Pt c/o nasal congestion, sinus headache and pressure. Using Flonase,, Zyrtec for allergies, and Advil for pain. Feels flush at times but no fever. She has been checking her temperature regularly. She reports that her granddaughter has croup and she has been around her. Denies respiratory symptoms.  Fully covid vaccinated. She has not been able to find covid testing.   ROS: See pertinent positives and negatives per HPI.  Patient Active Problem List   Diagnosis Date Noted  . Recurrent aphthous ulcer 07/03/2019  . Post-menopause on HRT (hormone replacement therapy) 09/10/2018  . Chronic allergic rhinitis 09/10/2018  . Adjustment disorder with anxiety 05/03/2015  . Cochleovestibular active Meniere's disease 05/03/2015  . 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 Insomnia 05/03/2015  . Vitamin D deficiency 05/03/2015  . Deviated nasal septum 05/03/2015    Social History   Tobacco Use  . Smoking status: Never Smoker  . Smokeless tobacco: Never Used  Substance Use Topics  . Alcohol use: Yes    Alcohol/week: 0.0 - 1.0 standard drinks    Current Outpatient Medications:  .  ALPRAZolam (XANAX) 0.5 MG tablet, Take 1 tablet (0.5 mg total) by mouth 3 (three) times  daily as needed. Take for vertigo or anxiety., Disp: 30 tablet, Rfl: 1 .  azelastine (ASTELIN) 0.1 % nasal spray, daily as needed., Disp: , Rfl: 5 .  cetirizine (ZYRTEC) 10 MG tablet, Take 10 mg by mouth daily., Disp: , Rfl:  .  Cholecalciferol (VITAMIN D3) 25 MCG (1000 UT) CAPS, Take by mouth., Disp: , Rfl:  .  diazepam (VALIUM) 5 MG tablet, Take 1 tablet (5 mg total) by mouth every 12 (twelve) hours as needed (vertigo)., Disp: 30 tablet, Rfl: 0 .  estradiol (ESTRACE) 0.1 MG/GM vaginal cream, Apply small amount topically twice weekly, Disp: 42.5 g, Rfl: 6 .  estradiol (ESTRACE) 0.5 MG tablet, 1/2 tab daily, Disp: 45 tablet, Rfl: 4 .  progesterone (PROMETRIUM) 100 MG capsule, TAKE 1 CAPSULE BY MOUTH EVERY DAY, Disp: 90 capsule, Rfl: 1 .  progesterone (PROMETRIUM) 100 MG capsule, Take 1 capsule (100 mg total) by mouth daily., Disp: 90 capsule, Rfl: 4 .  triamterene-hydrochlorothiazide (DYAZIDE) 37.5-25 MG per capsule, Take 1 capsule by mouth daily., Disp: , Rfl:  .  predniSONE (DELTASONE) 20 MG tablet, Take 2 tablets (40 mg total) by mouth daily., Disp: 10 tablet, Rfl: 0  Allergies  Allergen Reactions  . Sulfa Antibiotics Other (See Comments)  . Ciprofloxacin     Agitation/ tachy    Objective:   VITALS: Per patient if applicable, see vitals. GENERAL: Alert, appears well and in no acute distress. HEENT: Atraumatic, conjunctiva clear, no obvious abnormalities on inspection of external nose and ears. NECK: Normal movements of the head and neck. CARDIOPULMONARY: No increased WOB. Speaking in clear sentences. I:E ratio  WNL.  MS: Moves all visible extremities without noticeable abnormality. PSYCH: Pleasant and cooperative, well-groomed. Speech normal rate and rhythm. Affect is appropriate. Insight and judgement are appropriate. Attention is focused, linear, and appropriate.  NEURO: CN grossly intact. Oriented as arrived to appointment on time with no prompting. Moves both UE equally.  SKIN: No  obvious lesions, wounds, erythema, or cyanosis noted on face or hands.  Assessment and Plan:   Jody Clarke was seen today for sinus problem.  Diagnoses and all orders for this visit:  Sinus congestion  Other orders -     predniSONE (DELTASONE) 20 MG tablet; Take 2 tablets (40 mg total) by mouth daily.    No red flags on discussion, patient is not in any obvious distress during our visit. Discussed progression of most viral illness, and recommended supportive care at this point in time. Will trial oral prednisone with recommendations to reach out via MyChart if symptoms persist after 7 days from onset for possible antibiotic initiation. Discussed over the counter supportive care options, with recommendations to push fluids and rest. Reviewed return precautions including new/worsening fever, SOB, new/worsening cough or other concerns.  Recommended need to self-quarantine and practice social distancing until symptoms resolve. Discussed current recommendations for COVID testing -- mychart message sent. I recommend that patient follow-up if symptoms worsen or persist despite treatment x 7-10 days, sooner if needed.   CMA or LPN served as scribe during this visit. History, Physical, and Plan performed by medical provider. The above documentation has been reviewed and is accurate and complete.   [place], [place] 12/23/2019

## 2020-02-22 ENCOUNTER — Ambulatory Visit: Payer: Self-pay | Admitting: Nurse Practitioner

## 2020-02-22 ENCOUNTER — Telehealth: Payer: Self-pay

## 2020-02-22 NOTE — Telephone Encounter (Signed)
I do not feel comfortable prescribing benzos at this time. I do not want this patient to be disappointed when she comes if she has a different expectation. Maybe you could give her a heads up. I can talk to her about alternatives though.

## 2020-02-22 NOTE — Telephone Encounter (Signed)
[facility] 07/2019 H/o anxiety H/o Meniere's disease  PMP, off HRT   Spoke with pt. Pt states weaned off HRT x 1 month ago and now having increased anxiety and insomnia. Pt denies any other vasomotor sx. Pt states was given Rx Xanax by Dr Hyacinth Meeker in 07/2019 at [facility] and has only 2 pills left and also needing refill, but pt states wanting to discuss with provider on Rx refill and suggestions for controlling anxiety better. Pt denies any behavorial health counselor or therapist at this time.  Pt advised to have OV. Pt agreeable. Pt scheduled with Tresa Endo, NP today 11/8 at 3 pm. Pt verbalized understanding to date and time of appt and thankful for advice.  Routing to [place], NP  [employer] closed

## 2020-02-22 NOTE — Telephone Encounter (Signed)
Patient would like an appointment to discuss hormones and her taking alprazolam.

## 2020-02-22 NOTE — Progress Notes (Deleted)
GYNECOLOGY  VISIT  CC:   ***  HPI: 59 y.o. G2P0000 Married White or Caucasian female here for medication update.     GYNECOLOGIC HISTORY: Patient's last menstrual period was 04/16/2008. Contraception: *** Menopausal hormone therapy: ***  Patient Active Problem List   Diagnosis Date Noted  . Recurrent aphthous ulcer 07/03/2019  . Post-menopause on HRT (hormone replacement therapy) 09/10/2018  . Chronic allergic rhinitis 09/10/2018  . Adjustment disorder with anxiety 05/03/2015  . Cochleovestibular active Meniere's disease 05/03/2015  . 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 Insomnia 05/03/2015  . Vitamin D deficiency 05/03/2015  . Deviated nasal septum 05/03/2015    Past Medical History:  Diagnosis Date  . Allergy    Allergic rhinitis  . Anxiety   . Meniere's disease   . Osteopenia   . Post-menopause on HRT (hormone replacement therapy) 09/10/2018    Past Surgical History:  Procedure Laterality Date  . CATARACT EXTRACTION Left 2021  . ENDOLYMPHATIC SHUNT DECOMPRESSION  8/14   in [place]  . inner ear endolymphatic sac operation with shunt Left 2014  . KNEE ARTHROSCOPY     right  . KNEE SURGERY    . TONSILLECTOMY      MEDS:   Current Outpatient Medications on File Prior to Visit  Medication Sig Dispense Refill  . ALPRAZolam (XANAX) 0.5 MG tablet Take 1 tablet (0.5 mg total) by mouth 3 (three) times daily as needed. Take for vertigo or anxiety. 30 tablet 1  . azelastine (ASTELIN) 0.1 % nasal spray daily as needed.  5  . cetirizine (ZYRTEC) 10 MG tablet Take 10 mg by mouth daily.    . Cholecalciferol (VITAMIN D3) 25 MCG (1000 UT) CAPS Take by mouth.    . diazepam (VALIUM) 5 MG tablet Take 1 tablet (5 mg total) by mouth every 12 (twelve) hours as needed (vertigo). 30 tablet 0  . predniSONE (DELTASONE) 20 MG tablet Take 2 tablets (40 mg total) by mouth daily. 10 tablet 0  . triamterene-hydrochlorothiazide (DYAZIDE) 37.5-25 MG per capsule Take  1 capsule by mouth daily.     No current facility-administered medications on file prior to visit.    ALLERGIES: Sulfa antibiotics and Ciprofloxacin  Family History  Problem Relation Age of Onset  . Cancer Father   . Atrial fibrillation Father   . Osteoporosis Mother   . Breast cancer Neg Hx     SH:  ***  Review of Systems  PHYSICAL EXAMINATION:    LMP 04/16/2008     General appearance: alert, cooperative and appears stated age Neck: no adenopathy, supple, symmetrical, trachea midline and thyroid {CHL AMB PHY EX THYROID NORM DEFAULT:[id number]::"normal to inspection and palpation"} CV:  {Exam; heart brief:31539} Lungs:  {pe lungs ob:314451::"clear to auscultation, no wheezes, rales or rhonchi, symmetric air entry"} Breasts: {Exam; breast:13139::"normal appearance, no masses or tenderness"} Abdomen: soft, non-tender; bowel sounds normal; no masses,  no organomegaly Lymph:  no inguinal LAD noted  Pelvic: External genitalia:  no lesions              Urethra:  normal appearing urethra with no masses, tenderness or lesions              Bartholins and Skenes: normal                 Vagina: normal appearing vagina with normal color and discharge, no lesions              Cervix: {CHL AMB PHY EX CERVIX NORM  DEFAULT:[phone]::"no lesions"}              Bimanual Exam:  Uterus:  {CHL AMB PHY EX UTERUS NORM DEFAULT:[phone]::"normal size, contour, position, consistency, mobility, non-tender"}              Adnexa: {CHL AMB PHY EX ADNEXA NO MASS DEFAULT:[phone]::"no mass, fullness, tenderness"}              Rectovaginal: {yes no:314532}.  Confirms.              Anus:  normal sphincter tone, no lesions  Chaperone, ***, CMA, was present for exam.  Assessment: ***  Plan: ***   {NUMBERS; -10-45 JOINT ROM:10287} minutes of total time was spent for this patient encounter, including preparation, face-to-face counseling with the patient and coordination of care, and documentation of  the encounter.

## 2020-02-22 NOTE — Telephone Encounter (Signed)
Spoke with pt. Pt received detailed message. Pt verbalized understanding. Offered to still come to OV for alternate methods.  Pt wanting to cancel appt for today and will follow up with Dr Mardelle Matte.  Routing to [place], NP for review and update.  Addendum closed.

## 2020-02-22 NOTE — Telephone Encounter (Signed)
Left detailed message to pt about OV. Pt to return call.

## 2020-02-25 ENCOUNTER — Other Ambulatory Visit: Payer: Self-pay | Admitting: Obstetrics & Gynecology

## 2020-03-02 ENCOUNTER — Ambulatory Visit (INDEPENDENT_AMBULATORY_CARE_PROVIDER_SITE_OTHER): Payer: Managed Care, Other (non HMO) | Admitting: Family Medicine

## 2020-03-02 ENCOUNTER — Other Ambulatory Visit: Payer: Self-pay

## 2020-03-02 ENCOUNTER — Encounter: Payer: Self-pay | Admitting: Family Medicine

## 2020-03-02 VITALS — BP 98/68 | HR 97 | Temp 98.1°F | Wt 112.8 lb

## 2020-03-02 DIAGNOSIS — Z780 Asymptomatic menopausal state: Secondary | ICD-10-CM | POA: Diagnosis not present

## 2020-03-02 DIAGNOSIS — H8103 Meniere's disease, bilateral: Secondary | ICD-10-CM | POA: Diagnosis not present

## 2020-03-02 DIAGNOSIS — G4709 Other insomnia: Secondary | ICD-10-CM | POA: Diagnosis not present

## 2020-03-02 DIAGNOSIS — F4323 Adjustment disorder with mixed anxiety and depressed mood: Secondary | ICD-10-CM

## 2020-03-02 MED ORDER — ALPRAZOLAM 0.5 MG PO TABS: 0.5000 mg | ORAL_TABLET | Freq: Every evening | ORAL | 1 refills | Status: DC | PRN

## 2020-03-02 NOTE — Progress Notes (Signed)
Subjective  CC:  Chief Complaint  Patient presents with  . Meniere's Disease    ususally gets Xanax filled thru OB/GYN - provider on leave, needing medication prescribed by PCP    HPI: Jody Clarke is a 59 y.o. female who presents to the office today to address the problems listed above in the chief complaint.  59 year old postmenopausal female who recently weaned off her hormone replacement therapy as of October 1.  Has Mnire's disease with bouts of dizziness but more significantly bouts of anxiety produced by worry about dizziness and also her "uptight" personality.  These things have made sleep difficult.  Over the years she has used diazepam for dizziness since then, however the last year her GYN switched her to Xanax instead.  She feels groggy on the diazepam for this reason prefers Xanax.  She denies anxiety disorder.  She has been on antidepressants in the past and that made her feel worse.  She denies mood problems at this time.  Overall her life is very good.  No stressors.  But since coming off the hormones she tends to feel unsettled at times at night and this makes it difficult for her to sleep.  She is asking for refill of Xanax effects the best medication for active problem.   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 Adjustment disorder with anxiety   3. Postmenopausal   4. Secondary insomnia      Plan   Mnire's disorder with anxiety and insomnia worsened by stopping HRT: We discussed sleep, she has good sleep hygiene.  Discussed chronic anxiety but does not seem to be pathologic at this time.  Anxiety over the years which makes sense.  Does not tolerate Valium well.  Prefers to give herself more time off of HRT to see if she will start to improve.  We both agreed to continue Xanax on an as-needed basis.  We discussed risk versus benefits.  We will reevaluate in 3 to 6 months.  If sleep continues to be a problem I would recommend  another medication for sleep.  If she feels better on HRT, she is good candidate to continue it.  Postmenopausal off HRT, denies hot flashes, vaginal dryness, mood changes.  Sleep is a bit worse as noted above. Follow up: March for CPE and follow-up on the above-noted issues Visit date not found  No orders of the defined types were placed in this encounter.  Meds ordered this encounter  Medications  . ALPRAZolam (XANAX) 0.5 MG tablet    Sig: Take 1 tablet (0.5 mg total) by mouth at bedtime as needed for anxiety.    Dispense:  30 tablet    Refill:  1      I reviewed the patients updated PMH, FH, and SocHx.    Patient Active Problem List   Diagnosis Date Noted  . Cochleovestibular active Meniere's disease 05/03/2015    Priority: High  . Insomnia 05/03/2015    Priority: High  . Recurrent aphthous ulcer 07/03/2019    Priority: Medium  . Post-menopause on HRT (hormone replacement therapy) 09/10/2018    Priority: Medium  . Adjustment disorder with anxiety 05/03/2015    Priority: Medium  . Asymmetrical left sensorineural hearing loss 05/03/2015    Priority: Medium  . Chronic allergic rhinitis 09/10/2018    Priority: Low  . Vitamin D deficiency 05/03/2015    Priority: Low  . Deviated nasal septum 05/03/2015    Priority: Low  Current Meds  Medication Sig  . ALPRAZolam (XANAX) 0.5 MG tablet Take 1 tablet (0.5 mg total) by mouth at bedtime as needed for anxiety.  Marland Kitchen azelastine (ASTELIN) 0.1 % nasal spray daily as needed.  . cetirizine (ZYRTEC) 10 MG tablet Take 10 mg by mouth daily.  . Cholecalciferol (VITAMIN D3) 25 MCG (1000 UT) CAPS Take by mouth.  . triamterene-hydrochlorothiazide (DYAZIDE) 37.5-25 MG per capsule Take 1 capsule by mouth daily.  . [DISCONTINUED] ALPRAZolam (XANAX) 0.5 MG tablet Take 1 tablet (0.5 mg total) by mouth 3 (three) times daily as needed. Take for vertigo or anxiety.  . [DISCONTINUED] diazepam (VALIUM) 5 MG tablet Take 1 tablet (5 mg total) by mouth  every 12 (twelve) hours as needed (vertigo).  . [DISCONTINUED] predniSONE (DELTASONE) 20 MG tablet Take 2 tablets (40 mg total) by mouth daily.    Allergies: Patient is allergic to sulfa antibiotics and ciprofloxacin. Family History: Patient family history includes Atrial fibrillation in her father; Cancer in her father; Osteoporosis in her mother. Social History:  Patient  reports that she has never smoked. She has never used smokeless tobacco. She reports current alcohol use. She reports that she does not use drugs.  Review of Systems: Constitutional: Negative for fever malaise or anorexia Cardiovascular: negative for chest pain Respiratory: negative for SOB or persistent cough Gastrointestinal: negative for abdominal pain  Objective  Vitals: BP 98/68   Pulse 97   Temp 98.1 F (36.7 C) (Temporal)   Wt 112 lb 12.8 oz (51.2 kg)   LMP 04/16/2008   SpO2 98%   BMI 19.98 kg/m  General: no acute distress , A&Ox3 Psych: Well appearing, good insight normal affect     Commons side effects, risks, benefits, and alternatives for medications and treatment plan prescribed today were discussed, and the patient expressed understanding of the given instructions. Patient is instructed to call or message via MyChart if he/she has any questions or concerns regarding our treatment plan. No barriers to understanding were identified. We discussed Red Flag symptoms and signs in detail. Patient expressed understanding regarding what to do in case of urgent or emergency type symptoms.   Medication list was reconciled, printed and provided to the patient in AVS. Patient instructions and summary information was reviewed with the patient as documented in the AVS. This note was prepared with assistance of Dragon voice recognition software. Occasional wrong-word or sound-a-like substitutions may have occurred due to the inherent limitations of voice recognition software  This visit occurred during the  SARS-CoV-2 public health emergency.  Safety protocols were in place, including screening questions prior to the visit, additional usage of staff PPE, and extensive cleaning of exam room while observing appropriate contact time as indicated for disinfecting solutions.

## 2020-03-02 NOTE — Patient Instructions (Signed)
Please return in march for your annual complete physical; please come fasting.   If you have any questions or concerns, please don't hesitate to send me a message via MyChart or call the office at [phone]. Thank you for visiting with [place] today! It's our pleasure caring for you.

## 2020-03-03 ENCOUNTER — Ambulatory Visit: Payer: Managed Care, Other (non HMO) | Admitting: Family Medicine

## 2020-03-21 ENCOUNTER — Ambulatory Visit (INDEPENDENT_AMBULATORY_CARE_PROVIDER_SITE_OTHER): Payer: Managed Care, Other (non HMO) | Admitting: Family Medicine

## 2020-03-21 ENCOUNTER — Other Ambulatory Visit: Payer: Self-pay

## 2020-03-21 ENCOUNTER — Encounter: Payer: Self-pay | Admitting: Family Medicine

## 2020-03-21 VITALS — BP 102/70 | HR 97 | Temp 98.3°F | Ht 63.0 in | Wt 113.4 lb

## 2020-03-21 DIAGNOSIS — Z79890 Hormone replacement therapy (postmenopausal): Secondary | ICD-10-CM | POA: Diagnosis not present

## 2020-03-21 DIAGNOSIS — F5102 Adjustment insomnia: Secondary | ICD-10-CM | POA: Diagnosis not present

## 2020-03-21 DIAGNOSIS — R300 Dysuria: Secondary | ICD-10-CM

## 2020-03-21 LAB — POCT URINALYSIS DIPSTICK
Bilirubin, UA: NEGATIVE
Blood, UA: NEGATIVE
Glucose, UA: NEGATIVE
Ketones, UA: NEGATIVE
Leukocytes, UA: NEGATIVE
Nitrite, UA: NEGATIVE
Protein, UA: NEGATIVE
Spec Grav, UA: 1.015 (ref 1.010–1.025)
Urobilinogen, UA: 0.2 E.U./dL
pH, UA: 7 (ref 5.0–8.0)

## 2020-03-21 MED ORDER — PROGESTERONE MICRONIZED 100 MG PO CAPS
100.0000 mg | ORAL_CAPSULE | Freq: Every day | ORAL | 4 refills | Status: DC
Start: 2020-03-21 — End: 2020-07-28

## 2020-03-21 MED ORDER — ESTRADIOL 0.5 MG PO TABS: 0.5000 mg | ORAL_TABLET | Freq: Every day | ORAL | 4 refills | Status: DC

## 2020-03-21 NOTE — Patient Instructions (Signed)
Please return in March 2022 for your complete physical.   Restart your HRT. Let me know if you have problems.  Use xanax rarely and for anxiety as needed  If you have any questions or concerns, please don't hesitate to send me a message via MyChart or call the office at [phone]. Thank you for visiting with Jody Clarke today! It's our pleasure caring for you.

## 2020-03-21 NOTE — Progress Notes (Signed)
Subjective  CC:  Chief Complaint  Patient presents with  . Dysuria    burning and frequency, symptoms started yesterday  . Insomnia    wanting to start medication for insomnia    HPI: Jody Clarke is a 59 y.o. female who presents to the office today to address the problems listed above in the chief complaint.  See last note: stopped hrt in October and sleep worsened; she self treated with xanax: However, since her stay continues to worsen.  Xanax is no longer working.  She definitely felt better when she was on HRT.  She is a good candidate for HRT.  No cardiovascular risk factors, non-smoker, nondiabetic.  She does report history of osteopenia.  She is not currently suffering from dizziness for her Mnire's disease.  She had been on Valium for many years for this.  She tends to worry about this though and uses Xanax but infrequently.  Complains of mild dysuria for the last 12 to 24 hours.  No other symptoms.  No blood in the urine.  No history of recurrent urinary tract infections.   Assessment  1. Adjustment insomnia   2. Dysuria   3. Post-menopause on HRT (hormone replacement therapy)      Plan   Adjustment insomnia: Mostly related to stopping hormone replacement therapy, postmenopausal status.  Counseled at length.  She is a good HRT candidate.  Restart medications and this should help her sleep and possibly her dysuria as well.  Her urine is currently normal.  She will follow-up if worsens.  Mnire's disease anxiety: Use as needed Xanax.  If dizziness worsens, would reinstitute Valium.  Patient stands and agrees  Follow up: March for complete physical Visit date not found  Orders Placed This Encounter  Procedures  . POCT Urinalysis Dipstick   Meds ordered this encounter  Medications  . estradiol (ESTRACE) 0.5 MG tablet    Sig: Take 1 tablet (0.5 mg total) by mouth daily.    Dispense:  45 tablet    Refill:  4  . progesterone (PROMETRIUM) 100 MG capsule     Sig: Take 1 capsule (100 mg total) by mouth daily.    Dispense:  90 capsule    Refill:  4      I reviewed the patients updated PMH, FH, and SocHx.    Patient Active Problem List   Diagnosis Date Noted  . Cochleovestibular active Meniere's disease 05/03/2015    Priority: High  . Insomnia 05/03/2015    Priority: High  . Recurrent aphthous ulcer 07/03/2019    Priority: Medium  . Post-menopause on HRT (hormone replacement therapy) 09/10/2018    Priority: Medium  . Adjustment disorder with anxiety 05/03/2015    Priority: Medium  . Asymmetrical left sensorineural hearing loss 05/03/2015    Priority: Medium  . Chronic allergic rhinitis 09/10/2018    Priority: Low  . Vitamin D deficiency 05/03/2015    Priority: Low  . Deviated nasal septum 05/03/2015    Priority: Low   Current Meds  Medication Sig  . ALPRAZolam (XANAX) 0.5 MG tablet Take 1 tablet (0.5 mg total) by mouth at bedtime as needed for anxiety.  Marland Kitchen azelastine (ASTELIN) 0.1 % nasal spray daily as needed.  . cetirizine (ZYRTEC) 10 MG tablet Take 10 mg by mouth daily.  . Cholecalciferol (VITAMIN D3) 25 MCG (1000 UT) CAPS Take by mouth.  . triamterene-hydrochlorothiazide (DYAZIDE) 37.5-25 MG per capsule Take 1 capsule by mouth daily.    Allergies: Patient is  allergic to sulfa antibiotics and ciprofloxacin. Family History: Patient family history includes Atrial fibrillation in her father; Cancer in her father; Osteoporosis in her mother. Social History:  Patient  reports that she has never smoked. She has never used smokeless tobacco. She reports current alcohol use. She reports that she does not use drugs.  Review of Systems: Constitutional: Negative for fever malaise or anorexia Cardiovascular: negative for chest pain Respiratory: negative for SOB or persistent cough Gastrointestinal: negative for abdominal pain  Objective  Vitals: BP 102/70   Pulse 97   Temp 98.3 F (36.8 C) (Temporal)   Ht 5\' 3"  (1.6 m)   Wt  113 lb 6.4 oz (51.4 kg)   LMP 04/16/2008   SpO2 98%   BMI 20.09 kg/m  General: no acute distress , A&Ox3  Office Visit on 03/21/2020  Component Date Value Ref Range Status  . Color, UA 03/21/2020 yellow   Final  . Clarity, UA 03/21/2020 clear   Final  . Glucose, UA 03/21/2020 Negative  Negative Final  . Bilirubin, UA 03/21/2020 negative   Final  . Ketones, UA 03/21/2020 negative   Final  . Spec Grav, UA 03/21/2020 1.015  1.010 - 1.025 Final  . Blood, UA 03/21/2020 negative   Final  . pH, UA 03/21/2020 7.0  5.0 - 8.0 Final  . Protein, UA 03/21/2020 Negative  Negative Final  . Urobilinogen, UA 03/21/2020 0.2  0.2 or 1.0 E.U./dL Final  . Nitrite, UA 14/09/2019 negative   Final  . Leukocytes, UA 03/21/2020 Negative  Negative Final       Commons side effects, risks, benefits, and alternatives for medications and treatment plan prescribed today were discussed, and the patient expressed understanding of the given instructions. Patient is instructed to call or message via MyChart if he/she has any questions or concerns regarding our treatment plan. No barriers to understanding were identified. We discussed Red Flag symptoms and signs in detail. Patient expressed understanding regarding what to do in case of urgent or emergency type symptoms.   Medication list was reconciled, printed and provided to the patient in AVS. Patient instructions and summary information was reviewed with the patient as documented in the AVS. This note was prepared with assistance of Dragon voice recognition software. Occasional wrong-word or sound-a-like substitutions may have occurred due to the inherent limitations of voice recognition software  This visit occurred during the SARS-CoV-2 public health emergency.  Safety protocols were in place, including screening questions prior to the visit, additional usage of staff PPE, and extensive cleaning of exam room while observing appropriate contact time as indicated for  disinfecting solutions.

## 2020-04-27 ENCOUNTER — Ambulatory Visit: Payer: Managed Care, Other (non HMO) | Admitting: Obstetrics & Gynecology

## 2020-05-11 ENCOUNTER — Other Ambulatory Visit: Payer: Self-pay

## 2020-05-11 ENCOUNTER — Ambulatory Visit (INDEPENDENT_AMBULATORY_CARE_PROVIDER_SITE_OTHER): Payer: Managed Care, Other (non HMO) | Admitting: Obstetrics & Gynecology

## 2020-05-11 ENCOUNTER — Encounter: Payer: Self-pay | Admitting: Obstetrics & Gynecology

## 2020-05-11 VITALS — BP 98/69 | HR 89 | Resp 16 | Ht 63.0 in | Wt 115.0 lb

## 2020-05-11 DIAGNOSIS — H8103 Meniere's disease, bilateral: Secondary | ICD-10-CM

## 2020-05-11 DIAGNOSIS — Z79890 Hormone replacement therapy (postmenopausal): Secondary | ICD-10-CM | POA: Diagnosis not present

## 2020-05-11 NOTE — Progress Notes (Signed)
GYNECOLOGY  VISIT  CC:   Discuss HRT  HPI: 60 y.o. G2P0000 Married White or Caucasian female here for discussion of HRT.  She slowly weaned off her HRT last year and officially stopped in October.  Reports she did really well for about a month.  Then, she felt like she was having a little more insomnia.  Her husband went on a trip and then her insomnia seemed to get a little worse.  She reports this started to cause her some anxiety.  She was worried about whether she was using alprazolam a little more.  She saw Dr. Mardelle Matte and she decided to go back on the HRT.  She feels her sleep has gotten a lot better.  She's not using the alprazolam.  Last MMG was 07/06/2019.   Has been under good control with Meniere's disease.  She does use some alprazolam for control of symptoms.  Does not need Rx.    GYNECOLOGIC HISTORY: Patient's last menstrual period was 04/16/2008.  Patient Active Problem List   Diagnosis Date Noted  . Recurrent aphthous ulcer 07/03/2019  . Post-menopause on HRT (hormone replacement therapy) 09/10/2018  . Chronic allergic rhinitis 09/10/2018  . Adjustment disorder with anxiety 05/03/2015  . Cochleovestibular active Meniere's disease 05/03/2015  . 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 Insomnia 05/03/2015  . Vitamin D deficiency 05/03/2015  . Deviated nasal septum 05/03/2015    Past Medical History:  Diagnosis Date  . Allergy    Allergic rhinitis  . Anxiety   . Meniere's disease   . Osteopenia   . Post-menopause on HRT (hormone replacement therapy) 09/10/2018    Past Surgical History:  Procedure Laterality Date  . CATARACT EXTRACTION Left 2021  . ENDOLYMPHATIC SHUNT DECOMPRESSION  8/14   in [place]  . inner ear endolymphatic sac operation with shunt Left 2014  . KNEE ARTHROSCOPY     right  . KNEE SURGERY    . TONSILLECTOMY      MEDS:   Current Outpatient Medications on File Prior to Visit  Medication Sig Dispense Refill  . ALPRAZolam  (XANAX) 0.5 MG tablet Take 1 tablet (0.5 mg total) by mouth at bedtime as needed for anxiety. 30 tablet 1  . azelastine (ASTELIN) 0.1 % nasal spray daily as needed.  5  . cetirizine (ZYRTEC) 10 MG tablet Take 10 mg by mouth daily.    . Cholecalciferol (VITAMIN D3) 25 MCG (1000 UT) CAPS Take by mouth.    . estradiol (ESTRACE) 0.5 MG tablet Take 1 tablet (0.5 mg total) by mouth daily. 45 tablet 4  . progesterone (PROMETRIUM) 100 MG capsule Take 1 capsule (100 mg total) by mouth daily. 90 capsule 4  . triamterene-hydrochlorothiazide (DYAZIDE) 37.5-25 MG per capsule Take 1 capsule by mouth daily.     No current facility-administered medications on file prior to visit.    ALLERGIES: Sulfa antibiotics and Ciprofloxacin  Family History  Problem Relation Age of Onset  . Cancer Father   . Atrial fibrillation Father   . Osteoporosis Mother   . Breast cancer Neg Hx     SH:  Married, non somker  Review of Systems  All other systems reviewed and are negative.   PHYSICAL EXAMINATION:    BP 98/69   Pulse 89   Resp 16   Ht 5\' 3"  (1.6 m)   Wt 115 lb (52.2 kg)   LMP 04/16/2008   BMI 20.37 kg/m     General appearance: alert, cooperative and appears  stated age   Assessment/Plan: 1. Hormone replacement therapy (HRT) -does not need Rx at this itme  2.  Meniere's disease - does not need Rx for alprazolam.  We did discuss possibly changing back to diazepam but her symptoms are under good control.   25 minutes of total time was spent for this patient encounter, including preparation, face-to-face counseling with the patient and coordination of care, and documentation of the encounter.

## 2020-06-25 ENCOUNTER — Other Ambulatory Visit (HOSPITAL_BASED_OUTPATIENT_CLINIC_OR_DEPARTMENT_OTHER): Payer: Self-pay | Admitting: Obstetrics & Gynecology

## 2020-07-06 ENCOUNTER — Other Ambulatory Visit (HOSPITAL_BASED_OUTPATIENT_CLINIC_OR_DEPARTMENT_OTHER): Payer: Self-pay | Admitting: *Deleted

## 2020-07-06 MED ORDER — ALPRAZOLAM 0.5 MG PO TABS: 0.5000 mg | ORAL_TABLET | Freq: Every evening | ORAL | 0 refills | Status: DC | PRN

## 2020-07-06 NOTE — Telephone Encounter (Signed)
Pt called requesting Prescription for Xanax 0.5mg .  This is a prescription she gets from you.   She is in [place] as she has a new [place].  AEX scheduled 07/2020.   Please review order to be sent to pharmacy. Sherrilyn Rist CMA

## 2020-07-14 ENCOUNTER — Telehealth (INDEPENDENT_AMBULATORY_CARE_PROVIDER_SITE_OTHER): Payer: Managed Care, Other (non HMO) | Admitting: Physician Assistant

## 2020-07-14 ENCOUNTER — Other Ambulatory Visit: Payer: Self-pay

## 2020-07-14 VITALS — Temp 99.8°F

## 2020-07-14 DIAGNOSIS — J028 Acute pharyngitis due to other specified organisms: Secondary | ICD-10-CM

## 2020-07-14 DIAGNOSIS — J069 Acute upper respiratory infection, unspecified: Secondary | ICD-10-CM

## 2020-07-14 MED ORDER — PREDNISONE 20 MG PO TABS: 20.0000 mg | ORAL_TABLET | Freq: Two times a day (BID) | ORAL | 0 refills | Status: AC

## 2020-07-14 MED ORDER — BENZONATATE 100 MG PO CAPS: 100.0000 mg | ORAL_CAPSULE | Freq: Three times a day (TID) | ORAL | 0 refills | Status: AC | PRN

## 2020-07-14 NOTE — Progress Notes (Signed)
Virtual Visit via Video Note  I connected with Jody Clarke on 07/14/20 at  2:00 PM EDT by a video enabled telemedicine application and verified that I am speaking with the correct person using two identifiers.  Location: Patient: home Provider: Nature conservation officer at [employer]  I discussed the limitations of evaluation and management by telemedicine and the availability of in person appointments. The patient expressed understanding and agreed to proceed.   Only the patient and myself were present for today's video call.   History of Present Illness: Chief complaint: ST, fever Symptom onset: 5-6 days ago Pertinent positives: Nasal congestion, cough, body aches, fatigue Pertinent negatives: SOB, CP, loss of taste and smell Treatments tried: Acetaminophen and Advil  Vaccine status: Pfizer x 3  Sick exposure: Granddaughter & several family members all in the last week  At home COVID test two days ago was negative.    Observations/Objective: Temp 99.8 F today  Gen: Awake, alert, no acute distress Resp: Breathing is even and non-labored Psych: calm/pleasant demeanor Neuro: Alert and Oriented x 3, + facial symmetry, speech is clear.   Assessment and Plan: 1. Acute URI 2. Acute pharyngitis due to other specified organisms Going on close to a week with symptoms - reassured most likely viral given that all other family members have had same symptoms. Will try tessalon perles and prednisone for cough and ST symptoms. I will also try to get her scheduled in our office for PCR COVID, strep, and flu testing tomorrow with a nurse. She needs to continue to rest, push fluids, and take Tylenol / Ibuprofen prn, plus vitamins C, D, and zinc.  Follow Up Instructions:    I discussed the assessment and treatment plan with the patient. The patient was provided an opportunity to ask questions and all were answered. The patient agreed with the plan and demonstrated an understanding of  the instructions.   The patient was advised to call back or seek an in-person evaluation if the symptoms worsen or if the condition fails to improve as anticipated.  Chrysa Rampy M Hadyn Azer, PA-C

## 2020-07-14 NOTE — Patient Instructions (Signed)
Someone will call to schedule flu, strep, and COVID test for tomorrow. Continue at home treatments as you have been. ER if acutely worse or any SOB or CP develops.

## 2020-07-15 ENCOUNTER — Ambulatory Visit (INDEPENDENT_AMBULATORY_CARE_PROVIDER_SITE_OTHER): Payer: Managed Care, Other (non HMO)

## 2020-07-15 ENCOUNTER — Other Ambulatory Visit: Payer: Self-pay

## 2020-07-15 DIAGNOSIS — J069 Acute upper respiratory infection, unspecified: Secondary | ICD-10-CM

## 2020-07-15 DIAGNOSIS — Z1152 Encounter for screening for COVID-19: Secondary | ICD-10-CM

## 2020-07-15 DIAGNOSIS — J028 Acute pharyngitis due to other specified organisms: Secondary | ICD-10-CM | POA: Diagnosis not present

## 2020-07-16 LAB — NOVEL CORONAVIRUS, NAA: SARS-CoV-2, NAA: NOT DETECTED

## 2020-07-16 LAB — SARS-COV-2, NAA 2 DAY TAT

## 2020-07-18 LAB — POCT INFLUENZA A/B
Influenza A, POC: NEGATIVE
Influenza B, POC: NEGATIVE

## 2020-07-18 LAB — POCT RAPID STREP A (OFFICE): Rapid Strep A Screen: NEGATIVE

## 2020-07-21 ENCOUNTER — Encounter (HOSPITAL_BASED_OUTPATIENT_CLINIC_OR_DEPARTMENT_OTHER): Payer: Self-pay

## 2020-07-21 ENCOUNTER — Encounter (HOSPITAL_BASED_OUTPATIENT_CLINIC_OR_DEPARTMENT_OTHER): Payer: Self-pay | Admitting: Obstetrics & Gynecology

## 2020-07-22 ENCOUNTER — Ambulatory Visit: Payer: Managed Care, Other (non HMO) | Admitting: Physician Assistant

## 2020-07-22 ENCOUNTER — Telehealth (HOSPITAL_BASED_OUTPATIENT_CLINIC_OR_DEPARTMENT_OTHER): Payer: Self-pay | Admitting: *Deleted

## 2020-07-22 ENCOUNTER — Other Ambulatory Visit (HOSPITAL_COMMUNITY)
Admission: RE | Admit: 2020-07-22 | Discharge: 2020-07-22 | Disposition: A | Discharge status: Home / Self Care | Payer: Managed Care, Other (non HMO) | Source: Ambulatory Visit | Attending: Obstetrics & Gynecology | Admitting: Obstetrics & Gynecology

## 2020-07-22 ENCOUNTER — Ambulatory Visit (INDEPENDENT_AMBULATORY_CARE_PROVIDER_SITE_OTHER): Payer: Managed Care, Other (non HMO) | Admitting: Obstetrics & Gynecology

## 2020-07-22 ENCOUNTER — Other Ambulatory Visit (HOSPITAL_BASED_OUTPATIENT_CLINIC_OR_DEPARTMENT_OTHER): Payer: Self-pay

## 2020-07-22 ENCOUNTER — Ambulatory Visit (INDEPENDENT_AMBULATORY_CARE_PROVIDER_SITE_OTHER): Payer: Managed Care, Other (non HMO) | Admitting: Family Medicine

## 2020-07-22 ENCOUNTER — Other Ambulatory Visit: Payer: Self-pay

## 2020-07-22 VITALS — BP 120/80 | HR 107 | Wt 111.0 lb

## 2020-07-22 DIAGNOSIS — R300 Dysuria: Secondary | ICD-10-CM

## 2020-07-22 DIAGNOSIS — N760 Acute vaginitis: Secondary | ICD-10-CM | POA: Insufficient documentation

## 2020-07-22 DIAGNOSIS — N390 Urinary tract infection, site not specified: Secondary | ICD-10-CM | POA: Diagnosis not present

## 2020-07-22 LAB — POCT URINALYSIS DIPSTICK
Appearance: NORMAL
Bilirubin, UA: NEGATIVE
Blood, UA: NEGATIVE
Glucose, UA: NEGATIVE
Ketones, UA: NEGATIVE
Leukocytes, UA: NEGATIVE
Nitrite, UA: NEGATIVE
Odor: NEGATIVE
Protein, UA: NEGATIVE
Spec Grav, UA: 1.01 (ref 1.010–1.025)
Urobilinogen, UA: 0.2 E.U./dL
pH, UA: 6 (ref 5.0–8.0)

## 2020-07-22 MED ORDER — TERCONAZOLE 0.4 % VA CREA
TOPICAL_CREAM | VAGINAL | 0 refills | Status: DC
  Filled 2020-07-22: qty 45, 5d supply, fill #0

## 2020-07-22 NOTE — Progress Notes (Signed)
Patient was scheduled for office visit for UTI today.  She was already seen by her OB/GYN this morning for this.  She elected to cancel today's visit.  She was not seen or evaluated by me.  Katina Degree. Jimmey Ralph, MD 07/22/2020 11:46 AM

## 2020-07-22 NOTE — Progress Notes (Signed)
GYNECOLOGY  VISIT  CC:   Burning with urination  HPI: 60 y.o. G2P0000 Married White or Caucasian female here for complaint of burning with urination.  She reports she was seen in urgent care on Saturday due to the urinary symptoms.  She was treated with Keflex for 5 days.  She reports urgent care did call on Monday and tell her there was an infection and the Keflex should have covered.  Dosage was 500mg  bid x 5 days.    Separately, she had an upper respiratory infection.  She reports everyone in her family had this as well but she was the only one who ran a fever.  She reports having a fever last week before going to urgent care.  Tested negative for covid, flu and influenza.  Has not had a fever since last Friday.  Sinus drainage is improved.  Was started on steroids for 5 days.    GYNECOLOGIC HISTORY: Patient's last menstrual period was 04/16/2008. Contraception: PMP Menopausal hormone therapy: estrace 0.5mg  and prometrium 100mg   Patient Active Problem List   Diagnosis Date Noted  . Recurrent aphthous ulcer 07/03/2019  . Post-menopause on HRT (hormone replacement therapy) 09/10/2018  . Chronic allergic rhinitis 09/10/2018  . Adjustment disorder with anxiety 05/03/2015  . Cochleovestibular active Meniere's disease 05/03/2015  . 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 Insomnia 05/03/2015  . Vitamin D deficiency 05/03/2015  . Deviated nasal septum 05/03/2015    Past Medical History:  Diagnosis Date  . Allergy    Allergic rhinitis  . Anxiety   . Meniere's disease   . Osteopenia   . Post-menopause on HRT (hormone replacement therapy) 09/10/2018    Past Surgical History:  Procedure Laterality Date  . CATARACT EXTRACTION Left 2021  . ENDOLYMPHATIC SHUNT DECOMPRESSION  8/14   in [place]  . inner ear endolymphatic sac operation with shunt Left 2014  . KNEE ARTHROSCOPY     right  . KNEE SURGERY    . TONSILLECTOMY      MEDS:   Current Outpatient  Medications on File Prior to Visit  Medication Sig Dispense Refill  . ALPRAZolam (XANAX) 0.5 MG tablet Take 1 tablet (0.5 mg total) by mouth at bedtime as needed for anxiety (vertigo). 30 tablet 0  . azelastine (ASTELIN) 0.1 % nasal spray daily as needed.  5  . benzonatate (TESSALON PERLES) 100 MG capsule Take 1 capsule (100 mg total) by mouth 3 (three) times daily as needed for up to 10 days for cough. 30 capsule 0  . cetirizine (ZYRTEC) 10 MG tablet Take 10 mg by mouth daily.    . Cholecalciferol (VITAMIN D3) 25 MCG (1000 UT) CAPS Take by mouth.    . doxycycline (PERIOSTAT) 20 MG tablet TAKE 2 TABLETS BY MOUTH DAILY 180 tablet 0  . estradiol (ESTRACE) 0.5 MG tablet Take 1 tablet (0.5 mg total) by mouth daily. 45 tablet 4  . progesterone (PROMETRIUM) 100 MG capsule Take 1 capsule (100 mg total) by mouth daily. 90 capsule 4  . triamterene-hydrochlorothiazide (DYAZIDE) 37.5-25 MG per capsule Take 1 capsule by mouth daily.     No current facility-administered medications on file prior to visit.    ALLERGIES: Sulfa antibiotics and Ciprofloxacin  Family History  Problem Relation Age of Onset  . Cancer Father   . Atrial fibrillation Father   . Osteoporosis Mother   . Breast cancer Neg Hx     SH:  Married, non smoker  Review of Systems  Genitourinary: Positive  for dysuria.    PHYSICAL EXAMINATION:    BP 120/80   Pulse (!) 107   Wt 111 lb (50.3 kg)   LMP 04/16/2008   SpO2 100%   BMI 19.66 kg/m     General appearance: alert, cooperative and appears stated age Lymph:  no inguinal LAD noted  Pelvic: External genitalia:  no lesions              Urethra:  Erythema of urethra without lesion/mass, non tender              Bartholins and Skenes: normal                 Vagina: normal appearing vagina with normal color and discharge, no lesions              Cervix: no lesions              Bimanual Exam:  Uterus:  normal size, contour, position, consistency, mobility, non-tender               Adnexa: no mass, fullness, tenderness  Chaperone, Beola Cord, CMA, was present for exam.  Assessment/Plan: 1. Dysuria - Urine Culture; Future  2. Acute vaginitis - Cervicovaginal ancillary only( [place]) - terazol 7, apply topically twice daily until swab results

## 2020-07-22 NOTE — Telephone Encounter (Signed)
Per Dr. Hyacinth Meeker Pt called yesterday. Treated with keflex but still with some symptoms. Ok for her to come and leave a urine sample and treat with macrobid 100mg  x 5 days. If that doesn't resolve her symptoms, she will need an OV.  Pt informed and will come this am for UA dipstick.  Will send Rx in. KW CMA

## 2020-07-23 LAB — URINE CULTURE: Culture: NO GROWTH

## 2020-07-25 ENCOUNTER — Encounter (HOSPITAL_BASED_OUTPATIENT_CLINIC_OR_DEPARTMENT_OTHER): Payer: Self-pay | Admitting: Obstetrics & Gynecology

## 2020-07-25 LAB — CERVICOVAGINAL ANCILLARY ONLY
Bacterial Vaginitis (gardnerella): NEGATIVE
Candida Glabrata: NEGATIVE
Candida Vaginitis: NEGATIVE
Comment: NEGATIVE
Comment: NEGATIVE
Comment: NEGATIVE

## 2020-07-28 ENCOUNTER — Other Ambulatory Visit: Payer: Self-pay

## 2020-07-28 ENCOUNTER — Encounter (HOSPITAL_BASED_OUTPATIENT_CLINIC_OR_DEPARTMENT_OTHER): Payer: Self-pay | Admitting: Obstetrics & Gynecology

## 2020-07-28 ENCOUNTER — Ambulatory Visit (INDEPENDENT_AMBULATORY_CARE_PROVIDER_SITE_OTHER): Payer: Managed Care, Other (non HMO) | Admitting: Obstetrics & Gynecology

## 2020-07-28 VITALS — BP 110/75 | HR 92 | Ht 62.75 in | Wt 109.0 lb

## 2020-07-28 DIAGNOSIS — M8580 Other specified disorders of bone density and structure, unspecified site: Secondary | ICD-10-CM

## 2020-07-28 DIAGNOSIS — F419 Anxiety disorder, unspecified: Secondary | ICD-10-CM | POA: Insufficient documentation

## 2020-07-28 DIAGNOSIS — Z1231 Encounter for screening mammogram for malignant neoplasm of breast: Secondary | ICD-10-CM

## 2020-07-28 DIAGNOSIS — H8103 Meniere's disease, bilateral: Secondary | ICD-10-CM | POA: Diagnosis not present

## 2020-07-28 DIAGNOSIS — Z79890 Hormone replacement therapy (postmenopausal): Secondary | ICD-10-CM | POA: Diagnosis not present

## 2020-07-28 DIAGNOSIS — Z780 Asymptomatic menopausal state: Secondary | ICD-10-CM

## 2020-07-28 DIAGNOSIS — M810 Age-related osteoporosis without current pathological fracture: Secondary | ICD-10-CM | POA: Insufficient documentation

## 2020-07-28 DIAGNOSIS — Z01419 Encounter for gynecological examination (general) (routine) without abnormal findings: Secondary | ICD-10-CM

## 2020-07-28 MED ORDER — ESTRADIOL 0.5 MG PO TABS: 0.5000 mg | ORAL_TABLET | Freq: Every day | ORAL | 4 refills | Status: DC

## 2020-07-28 MED ORDER — PROGESTERONE MICRONIZED 100 MG PO CAPS: 100.0000 mg | ORAL_CAPSULE | Freq: Every day | ORAL | 4 refills | Status: DC

## 2020-07-28 NOTE — Progress Notes (Signed)
60 y.o. G35P0001 Married White or Caucasian female here for annual exam.  Denies vaginal bleeding.  Was seen last week for possible UTI.  Culture was negative.    Patient's last menstrual period was 04/16/2008.          Sexually active: Yes.    The current method of family planning is post menopausal status.    Exercising: Yes.    Walking and weights Smoker:  no  Health Maintenance: Pap:  07/2019 neg with HR HPV History of abnormal Pap:  no MMG:  07/06/2019 Colonoscopy:  cologuard neg 2020 BMD:   Ordered to do this year TDaP:  due Pneumonia vaccine(s):  n/a Shingrix:   Discussed today Hep C testing: 2017 Screening Labs: with PCP   reports that she has never smoked. She has never used smokeless tobacco. She reports current alcohol use. She reports that she does not use drugs.  Past Medical History:  Diagnosis Date  . Allergy    Allergic rhinitis  . Anxiety   . Meniere's disease   . Osteopenia   . Post-menopause on HRT (hormone replacement therapy) 09/10/2018    Past Surgical History:  Procedure Laterality Date  . CATARACT EXTRACTION Left 2021  . ENDOLYMPHATIC SHUNT DECOMPRESSION  8/14   in [place]  . inner ear endolymphatic sac operation with shunt Left 2014  . KNEE ARTHROSCOPY     right  . KNEE SURGERY    . TONSILLECTOMY      Current Outpatient Medications  Medication Sig Dispense Refill  . ALPRAZolam (XANAX) 0.5 MG tablet Take 1 tablet (0.5 mg total) by mouth at bedtime as needed for anxiety (vertigo). 30 tablet 0  . azelastine (ASTELIN) 0.1 % nasal spray daily as needed.  5  . cetirizine (ZYRTEC) 10 MG tablet Take 10 mg by mouth daily.    . Cholecalciferol (VITAMIN D3) 25 MCG (1000 UT) CAPS Take by mouth.    . estradiol (ESTRACE) 0.5 MG tablet Take 1 tablet (0.5 mg total) by mouth daily. 45 tablet 4  . progesterone (PROMETRIUM) 100 MG capsule Take 1 capsule (100 mg total) by mouth daily. 90 capsule 4  . triamterene-hydrochlorothiazide (DYAZIDE) 37.5-25 MG per  capsule Take 1 capsule by mouth daily.    Marland Kitchen doxycycline (PERIOSTAT) 20 MG tablet TAKE 2 TABLETS BY MOUTH DAILY (Patient not taking: Reported on 07/28/2020) 180 tablet 0  . terconazole (TERAZOL 7) 0.4 % vaginal cream Apply externally twice daily for 5 days. (Patient not taking: Reported on 07/28/2020) 45 g 0   No current facility-administered medications for this visit.    Family History  Problem Relation Age of Onset  . Cancer Father   . Atrial fibrillation Father   . Osteoporosis Mother   . Breast cancer Neg Hx     Review of Systems  Exam:   BP 110/75   Pulse 92   Ht 5' 2.75" (1.594 m)   Wt 109 lb (49.4 kg)   LMP 04/16/2008   BMI 19.46 kg/m   Height: 5' 2.75" (159.4 cm)  General appearance: alert, cooperative and appears stated age Head: Normocephalic, without obvious abnormality, atraumatic Neck: no adenopathy, supple, symmetrical, trachea midline and thyroid normal to inspection and palpation Lungs: clear to auscultation bilaterally Breasts: normal appearance, no masses or tenderness Heart: regular rate and rhythm Abdomen: soft, non-tender; bowel sounds normal; no masses,  no organomegaly Extremities: extremities normal, atraumatic, no cyanosis or edema Skin: Skin color, texture, turgor normal. No rashes or lesions Lymph nodes: Cervical, supraclavicular,  and axillary nodes normal. No abnormal inguinal nodes palpated Neurologic: Grossly normal   Pelvic: External genitalia:  no lesions              Urethra:  normal appearing urethra with no masses, tenderness or lesions              Bartholins and Skenes: normal                 Vagina: normal appearing vagina with normal color and discharge, no lesions              Cervix: no lesions              Pap taken: No. Bimanual Exam:  Uterus:  normal size, contour, position, consistency, mobility, non-tender              Adnexa: normal adnexa and no mass, fullness, tenderness               Rectovaginal: Confirms                Anus:  normal sphincter tone, no lesions  Chaperone, Margret Chance, CMA, was present for exam.  Assessment/Plan: 1. Well woman exam with routine gynecological exam - pap with HR HPV neg 2021 - cologuard neg 2020 - plan BMD this year - vaccines reviewed - lab work done with Dr. Mardelle Matte  2. Post-menopause on HRT (hormone replacement therapy) - estradiol (ESTRACE) 0.5 MG tablet; Take 1 tablet (0.5 mg total) by mouth daily.  Dispense: 90 tablet; Refill: 4 - progesterone (PROMETRIUM) 100 MG capsule; Take 1 capsule (100 mg total) by mouth daily.  Dispense: 90 capsule; Refill: 4  3. Meniere's disease of both ears  4. Anxiety - does not need xanax rx.  She will call when does.  Typically needs 1 prescription with 1 RF yearly  5.  Osteopenia, unspecified location - DG BONE DENSITY (DXA); Future

## 2020-09-06 ENCOUNTER — Ambulatory Visit (HOSPITAL_BASED_OUTPATIENT_CLINIC_OR_DEPARTMENT_OTHER)
Admission: RE | Admit: 2020-09-06 | Discharge: 2020-09-06 | Disposition: A | Discharge status: Home / Self Care | Payer: Managed Care, Other (non HMO) | Source: Ambulatory Visit | Attending: Obstetrics & Gynecology | Admitting: Obstetrics & Gynecology

## 2020-09-06 ENCOUNTER — Other Ambulatory Visit: Payer: Self-pay

## 2020-09-06 DIAGNOSIS — Z1231 Encounter for screening mammogram for malignant neoplasm of breast: Secondary | ICD-10-CM | POA: Insufficient documentation

## 2020-09-06 DIAGNOSIS — M8580 Other specified disorders of bone density and structure, unspecified site: Secondary | ICD-10-CM | POA: Insufficient documentation

## 2020-10-27 ENCOUNTER — Other Ambulatory Visit (HOSPITAL_BASED_OUTPATIENT_CLINIC_OR_DEPARTMENT_OTHER): Payer: Self-pay | Admitting: *Deleted

## 2020-10-27 NOTE — Telephone Encounter (Signed)
Pt called requesting refill on xanax. Pt states that she will be going out of town soon. Request sent to provider.

## 2020-10-28 MED ORDER — ALPRAZOLAM 0.5 MG PO TABS: 0.5000 mg | ORAL_TABLET | Freq: Every evening | ORAL | 0 refills | Status: DC | PRN

## 2021-02-09 ENCOUNTER — Other Ambulatory Visit: Payer: Managed Care, Other (non HMO) | Admitting: Obstetrics & Gynecology

## 2021-02-09 ENCOUNTER — Other Ambulatory Visit (HOSPITAL_BASED_OUTPATIENT_CLINIC_OR_DEPARTMENT_OTHER): Payer: Self-pay

## 2021-02-09 ENCOUNTER — Other Ambulatory Visit: Payer: Self-pay

## 2021-02-09 ENCOUNTER — Other Ambulatory Visit (HOSPITAL_COMMUNITY)
Admission: RE | Admit: 2021-02-09 | Discharge: 2021-02-09 | Disposition: A | Discharge status: Home / Self Care | Payer: Managed Care, Other (non HMO) | Source: Ambulatory Visit | Attending: Obstetrics & Gynecology | Admitting: Obstetrics & Gynecology

## 2021-02-09 ENCOUNTER — Encounter (HOSPITAL_BASED_OUTPATIENT_CLINIC_OR_DEPARTMENT_OTHER): Payer: Self-pay | Admitting: Obstetrics & Gynecology

## 2021-02-09 ENCOUNTER — Ambulatory Visit (INDEPENDENT_AMBULATORY_CARE_PROVIDER_SITE_OTHER): Payer: Managed Care, Other (non HMO) | Admitting: Obstetrics & Gynecology

## 2021-02-09 VITALS — BP 129/71 | HR 90 | Ht 62.5 in | Wt 111.8 lb

## 2021-02-09 DIAGNOSIS — R300 Dysuria: Secondary | ICD-10-CM | POA: Diagnosis not present

## 2021-02-09 DIAGNOSIS — N9089 Other specified noninflammatory disorders of vulva and perineum: Secondary | ICD-10-CM | POA: Diagnosis present

## 2021-02-09 LAB — POCT URINALYSIS DIPSTICK
Bilirubin, UA: NEGATIVE
Blood, UA: NEGATIVE
Glucose, UA: NEGATIVE
Ketones, UA: NEGATIVE
Leukocytes, UA: NEGATIVE
Nitrite, UA: NEGATIVE
Protein, UA: NEGATIVE
Spec Grav, UA: 1.015 (ref 1.010–1.025)
Urobilinogen, UA: 0.2 E.U./dL
pH, UA: 7.5 (ref 5.0–8.0)

## 2021-02-09 MED ORDER — NITROFURANTOIN MONOHYD MACRO 100 MG PO CAPS: 100.0000 mg | ORAL_CAPSULE | Freq: Two times a day (BID) | ORAL | 0 refills | Status: DC

## 2021-02-09 MED ORDER — NITROFURANTOIN MONOHYD MACRO 100 MG PO CAPS
100.0000 mg | ORAL_CAPSULE | Freq: Two times a day (BID) | ORAL | 0 refills | Status: DC
  Filled 2021-02-09: qty 10, 5d supply, fill #0

## 2021-02-09 MED ORDER — ALPRAZOLAM 0.5 MG PO TABS
0.5000 mg | ORAL_TABLET | Freq: Every evening | ORAL | 0 refills | Status: DC | PRN
  Filled 2021-02-09: qty 30, 30d supply, fill #0

## 2021-02-09 NOTE — Progress Notes (Signed)
GYNECOLOGY  VISIT  CC:   possible UTI  HPI: 60 y.o. G79P0001 Married White or Caucasian female here for complaint of dysuria that started about 2 to 2 1/2 weeks ago.  She went to see [facility] [employer].  She did this with a friend.  She started having some vulvar irritation and used some left over terazol cream.  Reports she thinks this helped some.  She stopped it when it started to feel better but then started to have some dysuria and urgency with pressure.  She does have urinary frequency as a baseline.  Denies blood in her urine.  She is having a little lower back pain.  Denies fever.    Denies vaginal bleeding or discharge.  Patient Active Problem List   Diagnosis Date Noted   Osteopenia 07/28/2020   Anxiety 07/28/2020   Meniere's disease of both ears 07/28/2020   Recurrent aphthous ulcer 07/03/2019   Postmenopausal 09/10/2018   Chronic allergic rhinitis 09/10/2018   Adjustment disorder with anxiety 05/03/2015   Cochleovestibular active Meniere's disease 05/03/2015   Asymmetrical left sensorineural hearing loss 05/03/2015   Insomnia 05/03/2015   Vitamin D deficiency 05/03/2015   Deviated nasal septum 05/03/2015    Past Medical History:  Diagnosis Date   Allergy    Allergic rhinitis   Anxiety    Meniere's disease    Osteopenia    Post-menopause on HRT (hormone replacement therapy) 09/10/2018    Past Surgical History:  Procedure Laterality Date   CATARACT EXTRACTION Left 2021   ENDOLYMPHATIC SHUNT DECOMPRESSION  8/14   in [place]   inner ear endolymphatic sac operation with shunt Left 2014   KNEE ARTHROSCOPY     right   KNEE SURGERY     TONSILLECTOMY      MEDS:   Current Outpatient Medications on File Prior to Visit  Medication Sig Dispense Refill   ALPRAZolam (XANAX) 0.5 MG tablet Take 1 tablet (0.5 mg total) by mouth at bedtime as needed for anxiety (vertigo). 30 tablet 0   azelastine (ASTELIN) 0.1 % nasal spray daily as needed.  5   cetirizine  (ZYRTEC) 10 MG tablet Take 10 mg by mouth daily.     Cholecalciferol (VITAMIN D3) 25 MCG (1000 UT) CAPS Take by mouth.     estradiol (ESTRACE) 0.5 MG tablet Take 1 tablet (0.5 mg total) by mouth daily. 90 tablet 4   progesterone (PROMETRIUM) 100 MG capsule Take 1 capsule (100 mg total) by mouth daily. 90 capsule 4   triamterene-hydrochlorothiazide (DYAZIDE) 37.5-25 MG per capsule Take 1 capsule by mouth daily.     doxycycline (PERIOSTAT) 20 MG tablet TAKE 2 TABLETS BY MOUTH DAILY (Patient not taking: No sig reported) 180 tablet 0   No current facility-administered medications on file prior to visit.    ALLERGIES: Sulfa antibiotics and Ciprofloxacin  Family History  Problem Relation Age of Onset   Cancer Father    Atrial fibrillation Father    Osteoporosis Mother    Breast cancer Neg Hx     SH:  married, non smoker  Review of Systems  Constitutional: Negative.   Genitourinary:  Positive for dysuria and urgency.   PHYSICAL EXAMINATION:    BP 129/71 (BP Location: Left Arm, Patient Position: Sitting, Cuff Size: Normal)   Pulse 90   Ht 5' 2.5" (1.588 m)   Wt 111 lb 12.8 oz (50.7 kg)   LMP 04/16/2008   BMI 20.12 kg/m     General appearance: alert, cooperative  and appears stated age Lymph:  no inguinal LAD noted  Pelvic: External genitalia:  no lesions              Urethra:  normal appearing urethra with no masses, tenderness or lesions              Bartholins and Skenes: normal                 Vagina: normal appearing vagina with normal color and discharge, no lesions              Cervix: no lesions              Bimanual Exam:  Uterus:  normal size, contour, position, consistency, mobility, non-tender              Adnexa: no mass, fullness, tenderness  Chaperone, Ina Homes, CMA, was present for exam.  Assessment/Plan: 1. Dysuria - Urine Culture - Will go ahead and treat with nitrofurantoin, macrocrystal-monohydrate, (MACROBID) 100 MG capsule; Take 1 capsule (100 mg  total) by mouth 2 (two) times daily.  Dispense: 10 capsule; Refill: 0 - POCT Urinalysis Dipstick  2. Vulvar irritation - Cervicovaginal ancillary only( [place])

## 2021-02-10 LAB — CERVICOVAGINAL ANCILLARY ONLY
Bacterial Vaginitis (gardnerella): NEGATIVE
Candida Glabrata: NEGATIVE
Candida Vaginitis: NEGATIVE
Comment: NEGATIVE
Comment: NEGATIVE
Comment: NEGATIVE

## 2021-02-14 ENCOUNTER — Other Ambulatory Visit (HOSPITAL_BASED_OUTPATIENT_CLINIC_OR_DEPARTMENT_OTHER): Payer: Managed Care, Other (non HMO)

## 2021-02-14 ENCOUNTER — Ambulatory Visit: Payer: Managed Care, Other (non HMO)

## 2021-02-14 ENCOUNTER — Other Ambulatory Visit: Payer: Self-pay

## 2021-02-14 DIAGNOSIS — R300 Dysuria: Secondary | ICD-10-CM

## 2021-02-14 LAB — URINE CULTURE: Organism ID, Bacteria: NO GROWTH

## 2021-02-14 NOTE — Progress Notes (Signed)
Patient came in today to give a urine sample. Patient states she is still having symptoms. tbw

## 2021-02-16 NOTE — Addendum Note (Signed)
Addended by: Harrie Jeans on: 02/16/2021 02:10 PM   Modules accepted: Orders

## 2021-02-18 LAB — URINE CULTURE: Organism ID, Bacteria: NO GROWTH

## 2021-07-20 ENCOUNTER — Other Ambulatory Visit (HOSPITAL_BASED_OUTPATIENT_CLINIC_OR_DEPARTMENT_OTHER): Payer: Self-pay | Admitting: *Deleted

## 2021-07-20 MED ORDER — ALPRAZOLAM 0.5 MG PO TABS: 0.5000 mg | ORAL_TABLET | Freq: Every evening | ORAL | 0 refills | Status: DC | PRN

## 2021-09-14 ENCOUNTER — Other Ambulatory Visit (HOSPITAL_BASED_OUTPATIENT_CLINIC_OR_DEPARTMENT_OTHER): Payer: Self-pay

## 2021-09-14 DIAGNOSIS — Z79890 Hormone replacement therapy (postmenopausal): Secondary | ICD-10-CM

## 2021-09-14 MED ORDER — ESTRADIOL 0.5 MG PO TABS: 0.5000 mg | ORAL_TABLET | Freq: Every day | ORAL | 0 refills | Status: DC

## 2021-09-14 MED ORDER — PROGESTERONE MICRONIZED 100 MG PO CAPS: 100.0000 mg | ORAL_CAPSULE | Freq: Every day | ORAL | 0 refills | Status: DC

## 2021-11-06 ENCOUNTER — Other Ambulatory Visit (HOSPITAL_BASED_OUTPATIENT_CLINIC_OR_DEPARTMENT_OTHER): Payer: Self-pay | Admitting: Obstetrics & Gynecology

## 2021-11-06 DIAGNOSIS — Z1231 Encounter for screening mammogram for malignant neoplasm of breast: Secondary | ICD-10-CM

## 2021-11-24 ENCOUNTER — Ambulatory Visit (HOSPITAL_BASED_OUTPATIENT_CLINIC_OR_DEPARTMENT_OTHER)
Admission: RE | Admit: 2021-11-24 | Discharge: 2021-11-24 | Disposition: A | Discharge status: Home / Self Care | Payer: Managed Care, Other (non HMO) | Source: Ambulatory Visit | Attending: Obstetrics & Gynecology | Admitting: Obstetrics & Gynecology

## 2021-11-24 DIAGNOSIS — Z1231 Encounter for screening mammogram for malignant neoplasm of breast: Secondary | ICD-10-CM | POA: Insufficient documentation

## 2021-12-04 ENCOUNTER — Encounter: Payer: Self-pay | Admitting: Family Medicine

## 2021-12-04 ENCOUNTER — Ambulatory Visit (INDEPENDENT_AMBULATORY_CARE_PROVIDER_SITE_OTHER): Payer: Managed Care, Other (non HMO) | Admitting: Family Medicine

## 2021-12-04 VITALS — BP 110/64 | HR 87 | Temp 98.4°F | Ht 62.5 in | Wt 113.0 lb

## 2021-12-04 DIAGNOSIS — F4323 Adjustment disorder with mixed anxiety and depressed mood: Secondary | ICD-10-CM | POA: Diagnosis not present

## 2021-12-04 DIAGNOSIS — Z0000 Encounter for general adult medical examination without abnormal findings: Secondary | ICD-10-CM | POA: Diagnosis not present

## 2021-12-04 DIAGNOSIS — F5102 Adjustment insomnia: Secondary | ICD-10-CM | POA: Diagnosis not present

## 2021-12-04 DIAGNOSIS — H8103 Meniere's disease, bilateral: Secondary | ICD-10-CM | POA: Diagnosis not present

## 2021-12-04 DIAGNOSIS — J309 Allergic rhinitis, unspecified: Secondary | ICD-10-CM

## 2021-12-04 DIAGNOSIS — Z1211 Encounter for screening for malignant neoplasm of colon: Secondary | ICD-10-CM

## 2021-12-04 DIAGNOSIS — M8580 Other specified disorders of bone density and structure, unspecified site: Secondary | ICD-10-CM | POA: Diagnosis not present

## 2021-12-04 LAB — CBC WITH DIFFERENTIAL/PLATELET
Basophils Absolute: 0 10*3/uL (ref 0.0–0.1)
Basophils Relative: 0.6 % (ref 0.0–3.0)
Eosinophils Absolute: 0.3 10*3/uL (ref 0.0–0.7)
Eosinophils Relative: 5.1 % — ABNORMAL HIGH (ref 0.0–5.0)
HCT: 41.2 % (ref 36.0–46.0)
Hemoglobin: 14.1 g/dL (ref 12.0–15.0)
Lymphocytes Relative: 33.4 % (ref 12.0–46.0)
Lymphs Abs: 1.9 10*3/uL (ref 0.7–4.0)
MCHC: 34.1 g/dL (ref 30.0–36.0)
MCV: 93.7 fl (ref 78.0–100.0)
Monocytes Absolute: 0.5 10*3/uL (ref 0.1–1.0)
Monocytes Relative: 8 % (ref 3.0–12.0)
Neutro Abs: 3 10*3/uL (ref 1.4–7.7)
Neutrophils Relative %: 52.9 % (ref 43.0–77.0)
Platelets: 288 10*3/uL (ref 150.0–400.0)
RBC: 4.4 Mil/uL (ref 3.87–5.11)
RDW: 12.4 % (ref 11.5–15.5)
WBC: 5.7 10*3/uL (ref 4.0–10.5)

## 2021-12-04 LAB — COMPREHENSIVE METABOLIC PANEL
ALT: 16 U/L (ref 0–35)
AST: 20 U/L (ref 0–37)
Albumin: 4.4 g/dL (ref 3.5–5.2)
Alkaline Phosphatase: 43 U/L (ref 39–117)
BUN: 13 mg/dL (ref 6–23)
CO2: 31 mEq/L (ref 19–32)
Calcium: 9.3 mg/dL (ref 8.4–10.5)
Chloride: 98 mEq/L (ref 96–112)
Creatinine, Ser: 0.73 mg/dL (ref 0.40–1.20)
GFR: 88.9 mL/min (ref >60.00)
Glucose, Bld: 89 mg/dL (ref 70–99)
Potassium: 3.2 mEq/L — ABNORMAL LOW (ref 3.5–5.1)
Sodium: 137 mEq/L (ref 135–145)
Total Bilirubin: 0.4 mg/dL (ref 0.2–1.2)
Total Protein: 7.1 g/dL (ref 6.0–8.3)

## 2021-12-04 LAB — LIPID PANEL
Cholesterol: 193 mg/dL (ref 0–200)
HDL: 57.7 mg/dL (ref >39.00)
LDL Cholesterol: 120 mg/dL — ABNORMAL HIGH (ref 0–99)
NonHDL: 135.67
Total CHOL/HDL Ratio: 3
Triglycerides: 77 mg/dL (ref 0.0–149.0)
VLDL: 15.4 mg/dL (ref 0.0–40.0)

## 2021-12-04 LAB — TSH: TSH: 1.97 u[IU]/mL (ref 0.35–5.50)

## 2021-12-04 NOTE — Progress Notes (Signed)
Subjective  Chief Complaint  Patient presents with   Annual Exam    Pt here for Annual exam and is not currently fasting     HPI: Jody Clarke is a 61 y.o. female who presents to [facility] [place] today for a Female Wellness Visit. She also has the concerns and/or needs as listed above in the chief complaint. These will be addressed in addition to the Health Maintenance Visit.   Wellness Visit: annual visit with health maintenance review and exam without Pap  HM: to see gyn next week. Mammo and pap are current. Due colorectal cancer screen. Neg cologuard in 2020. Healthy lifestyle. Regular exercise. Eligible for shingrix and tdap but defers today.  Chronic disease f/u and/or acute problem visit: (deemed necessary to be done in addition to the wellness visit): Postmenopausal HRT.  She reports these symptoms are well controlled. Chronic anxiety: Treats intermittently with Xanax but mostly manages behaviorally.  Complains of not being able to take a deep breath in.  This has been a chronic problem and mostly related to her anxiety.  However, she feels like when she lays on her side at night her chest feels a little tighter than normal.  She denies specifically left-sided chest pain, heaviness in her chest, exertional symptoms, associated shortness of breath, nausea vomiting diaphoresis.  Feels like her typical anxiety breathing symptoms but slightly worse.  Stressors are stable.  No GERD symptoms.  No cough Osteopenia: exercising regularly Chornic allergies: worsening. Has been on same meds for years. Requests allergy referral.  Meniere's - mostly stable on diazide but will get intermittent vertigo.  Assessment  1. Annual physical exam   2. Active cochleovestibular Meniere's disease of both ears   3. Adjustment insomnia   4. Adjustment disorder with anxiety   5. Osteopenia, unspecified location   6. Colon cancer screening   7. Chronic allergic rhinitis      Plan   Female Wellness Visit: Age appropriate Health Maintenance and Prevention measures were discussed with patient. Included topics are cancer screening recommendations, ways to keep healthy (see AVS) including dietary and exercise recommendations, regular eye and dental care, use of seat belts, and avoidance of moderate alcohol use and tobacco use. Cologuard for screening ordered BMI: discussed patient's BMI and encouraged positive lifestyle modifications to help get to or maintain a target BMI. HM needs and immunizations were addressed and ordered. See below for orders. See HM and immunization section for updates. She will schedule nurse visit and return for shingrix and tdap Routine labs and screening tests ordered including cmp, cbc and lipids where appropriate. Discussed recommendations regarding Vit D and calcium supplementation (see AVS)  Chronic disease management visit and/or acute problem visit: Meniere's continue maxzide 1 daily and f/u with ent Allergies: refer to allergist for testing and med adjustment. Doubt asthma but can discuss with them as well.  Anxiety: chronic and chronic problems feeling tight in chest. No red flag sxs. Improves with xanax. Continue anxiety mgt.  Insomnia; xanax prn.   Follow up: 12 mo for cpe  Orders Placed This Encounter  Procedures   Cologuard   CBC with Differential/Platelet   Comprehensive metabolic panel   Lipid panel   TSH   Ambulatory referral to Allergy   No orders of the defined types were placed in this encounter.     Body mass index is 20.34 kg/m. Wt Readings from Last 3 Encounters:  12/04/21 113 lb (51.3 kg)  02/09/21 111 lb 12.8  oz (50.7 kg)  07/28/20 109 lb (49.4 kg)     Patient Active Problem List   Diagnosis Date Noted   Cochleovestibular active Meniere's disease 05/03/2015    Priority: High   Insomnia 05/03/2015    Priority: High   Osteopenia 07/28/2020    Priority: Medium    Recurrent aphthous ulcer 07/03/2019     Priority: Medium    Postmenopausal HRT (hormone replacement therapy) 09/10/2018    Priority: Medium    Adjustment disorder with anxiety 05/03/2015    Priority: Medium    Asymmetrical left sensorineural hearing loss 05/03/2015    Priority: Medium    Chronic allergic rhinitis 09/10/2018    Priority: Low   Vitamin D deficiency 05/03/2015    Priority: Low   Deviated nasal septum 05/03/2015    Priority: Low   Health Maintenance  Topic Date Due   Fecal DNA (Cologuard)  04/17/2021   INFLUENZA VACCINE  11/14/2021   COVID-19 Vaccine (4 - Pfizer series) 12/20/2021 (Originally 06/15/2020)   Zoster Vaccines- Shingrix (1 of 2) 03/06/2022 (Originally 09/21/2010)   TETANUS/TDAP  12/05/2022 (Originally 09/21/1979)   MAMMOGRAM  11/25/2022   PAP SMEAR-Modifier  07/27/2024   Hepatitis C Screening  Completed   HPV VACCINES  Aged Out   HIV Screening  Discontinued   Immunization History  Administered Date(s) Administered   PFIZER(Purple Top)SARS-COV-2 Vaccination 07/15/2019, 07/28/2019, 04/20/2020   We updated and reviewed the patient's past history in detail and it is documented below. Allergies: Patient is allergic to sulfa antibiotics and ciprofloxacin. Past Medical History Patient  has a past medical history of Allergy, Anxiety, Meniere's disease, Osteopenia, and Post-menopause on HRT (hormone replacement therapy) (09/10/2018). Past Surgical History Patient  has a past surgical history that includes inner ear endolymphatic sac operation with shunt (Left, 2014); Tonsillectomy; Knee surgery; Knee arthroscopy; Endolymphatic shunt decompression (8/14); and Cataract extraction (Left, 2021). Family History: Patient family history includes Atrial fibrillation in her father; Cancer in her father; Osteoporosis in her mother. Social History:  Patient  reports that she has never smoked. She has never used smokeless tobacco. She reports current alcohol use. She reports that she does not use drugs.  Review of  Systems: Constitutional: negative for fever or malaise Ophthalmic: negative for photophobia, double vision or loss of vision Cardiovascular: negative for chest pain, dyspnea on exertion, or new LE swelling Respiratory: negative for SOB or persistent cough Gastrointestinal: negative for abdominal pain, change in bowel habits or melena Genitourinary: negative for dysuria or gross hematuria, no abnormal uterine bleeding or disharge Musculoskeletal: negative for new gait disturbance or muscular weakness Integumentary: negative for new or persistent rashes, no breast lumps Neurological: negative for TIA or stroke symptoms Psychiatric: negative for SI or delusions Allergic/Immunologic: negative for hives  Patient Care Team    Relationship Specialty Notifications Start End  Willow Ora, MD PCP - General Family Medicine  09/10/18   Jerene Bears, MD Consulting Physician Gynecology  09/10/18   Christia Reading, MD Consulting Physician Otolaryngology  09/10/18   Venancio Poisson, MD Consulting Physician Dermatology  10/09/18   Jerene Bears, MD Consulting Physician Gynecology  10/09/18     Objective  Vitals: BP 110/64   Pulse 87   Temp 98.4 F (36.9 C)   Ht 5' 2.5" (1.588 m)   Wt 113 lb (51.3 kg)   LMP 04/16/2008   SpO2 97%   BMI 20.34 kg/m  General:  Well developed, well nourished, no acute distress  Psych:  Alert and orientedx3,normal  mood and affect HEENT:  Normocephalic, atraumatic, non-icteric sclera,  supple neck without adenopathy, mass or thyromegaly Cardiovascular:  Normal S1, S2, RRR without gallop, rub or murmur Respiratory:  Good breath sounds bilaterally, CTAB with normal respiratory effort Gastrointestinal: normal bowel sounds, soft, non-tender, no noted masses. No HSM MSK: no deformities, contusions. Joints are without erythema or swelling.    Commons side effects, risks, benefits, and alternatives for medications and treatment plan prescribed today were discussed, and the  patient expressed understanding of the given instructions. Patient is instructed to call or message via MyChart if he/she has any questions or concerns regarding our treatment plan. No barriers to understanding were identified. We discussed Red Flag symptoms and signs in detail. Patient expressed understanding regarding what to do in case of urgent or emergency type symptoms.  Medication list was reconciled, printed and provided to the patient in AVS. Patient instructions and summary information was reviewed with the patient as documented in the AVS. This note was prepared with assistance of Dragon voice recognition software. Occasional wrong-word or sound-a-like substitutions may have occurred due to the inherent limitations of voice recognition software  This visit occurred during the SARS-CoV-2 public health emergency.  Safety protocols were in place, including screening questions prior to the visit, additional usage of staff PPE, and extensive cleaning of exam room while observing appropriate contact time as indicated for disinfecting solutions.

## 2021-12-04 NOTE — Patient Instructions (Signed)
Please return in 12 months for your annual complete physical; please come fasting.  Schedule nurse visit for your shingrix and tdap vaccinations when you can.  I will release your lab results to you on your MyChart account with further instructions. You may see the results before I do, but when I review them I will send you a message with my report or have my assistant call you if things need to be discussed. Please reply to my message with any questions. Thank you!   I recommend the Cologuard test for your colon cancer screening that is due. I have ordered this test for you. [facility] will soon contact you to verify your insurance, address etc. They will then send you the kit; follow the instructions in the kit and return the kit to Colog[facility]. They will run the test and send the results to me. I will then give you the results. If this test is negative, we recommend repeating a colon cancer screening test in 3 years. If it is positive, I will refer you to a Gastroenterologist so you can get set up for the recommended colonoscopy.  Thank you!   If you have any questions or concerns, please don't hesitate to send me a message via MyChart or call the office at [phone]. Thank you for visiting with [place] today! It's our pleasure caring for you.   Please do these things to maintain good health!  Exercise at least 30-45 minutes a day,  4-5 days a week.  Eat a low-fat diet with lots of fruits and vegetables, up to 7-9 servings per day. Drink plenty of water daily. Try to drink 8 8oz glasses per day. Seatbelts can save your life. Always wear your seatbelt. Place Smoke Detectors on every level of your home and check batteries every year. Schedule an appointment with an eye doctor for an eye exam every 1-2 years Safe sex - use condoms to protect yourself from STDs if you could be exposed to these types of infections. Use birth control if you do not want to become pregnant and are sexually  active. Avoid heavy alcohol use. If you drink, keep it to less than 2 drinks/day and not every day. [place].  Choose someone you trust that could speak for you if you became unable to speak for yourself. Depression is common in our stressful world.If you're feeling down or losing interest in things you normally enjoy, please come in for a visit. If anyone is threatening or hurting you, please get help. Physical or Emotional Violence is never OK.

## 2021-12-06 ENCOUNTER — Other Ambulatory Visit: Payer: Self-pay

## 2021-12-06 DIAGNOSIS — Z8639 Personal history of other endocrine, nutritional and metabolic disease: Secondary | ICD-10-CM

## 2021-12-06 MED ORDER — POTASSIUM CHLORIDE CRYS ER 10 MEQ PO TBCR: 10.0000 meq | EXTENDED_RELEASE_TABLET | Freq: Two times a day (BID) | ORAL | 3 refills | Status: DC

## 2021-12-07 ENCOUNTER — Ambulatory Visit (INDEPENDENT_AMBULATORY_CARE_PROVIDER_SITE_OTHER): Payer: Managed Care, Other (non HMO) | Admitting: Obstetrics & Gynecology

## 2021-12-07 ENCOUNTER — Encounter (HOSPITAL_BASED_OUTPATIENT_CLINIC_OR_DEPARTMENT_OTHER): Payer: Self-pay | Admitting: Obstetrics & Gynecology

## 2021-12-07 VITALS — BP 101/72 | HR 79 | Ht 63.5 in | Wt 113.4 lb

## 2021-12-07 DIAGNOSIS — R3915 Urgency of urination: Secondary | ICD-10-CM | POA: Diagnosis not present

## 2021-12-07 DIAGNOSIS — Z1211 Encounter for screening for malignant neoplasm of colon: Secondary | ICD-10-CM | POA: Diagnosis not present

## 2021-12-07 DIAGNOSIS — Z01419 Encounter for gynecological examination (general) (routine) without abnormal findings: Secondary | ICD-10-CM

## 2021-12-07 DIAGNOSIS — Z79890 Hormone replacement therapy (postmenopausal): Secondary | ICD-10-CM

## 2021-12-07 DIAGNOSIS — M8580 Other specified disorders of bone density and structure, unspecified site: Secondary | ICD-10-CM

## 2021-12-07 DIAGNOSIS — F419 Anxiety disorder, unspecified: Secondary | ICD-10-CM | POA: Diagnosis not present

## 2021-12-07 MED ORDER — ESTRADIOL 0.1 MG/GM VA CREA: TOPICAL_CREAM | VAGINAL | 2 refills | Status: DC

## 2021-12-07 MED ORDER — ESTRADIOL 0.5 MG PO TABS: 0.5000 mg | ORAL_TABLET | Freq: Every day | ORAL | 4 refills | Status: DC

## 2021-12-07 MED ORDER — HYDROXYZINE HCL 10 MG PO TABS: 10.0000 mg | ORAL_TABLET | Freq: Three times a day (TID) | ORAL | 0 refills | Status: DC | PRN

## 2021-12-07 MED ORDER — PROGESTERONE MICRONIZED 100 MG PO CAPS: 100.0000 mg | ORAL_CAPSULE | Freq: Every day | ORAL | 3 refills | Status: DC

## 2021-12-07 MED ORDER — ALPRAZOLAM 0.5 MG PO TABS: 0.5000 mg | ORAL_TABLET | Freq: Every evening | ORAL | 0 refills | Status: DC | PRN

## 2021-12-07 NOTE — Patient Instructions (Signed)
[email or web address]

## 2021-12-07 NOTE — Progress Notes (Signed)
61 y.o. G2P0001 Married White or Caucasian female here for annual exam.  Doing well.  Joined [employer] in the fall.  Has been able to exercise regularly.    Having some issues with sensation of UTI.  Feels like at times she has some "inflammation" and other times that there is an actual infection.  Urine cultures have been negative.  She feels this has been present mostly since menopause.  She did see a urologist in the past.  Has office cystoscopy and was advised there really wasn't much present.  Medications, PT, dietary changes, urology or urogyn referral discussed as well.  Saw. Dr. Mardelle Matte earlier this week.  Blood work was done.  Potassium was a little low.  Was prescribed potassium.   Patient's last menstrual period was 04/16/2008.          Sexually active: Yes.    The current method of family planning is post menopausal status.    Exercising: Yes.    Exercising at [employer] Smoker:  no  Health Maintenance: Pap:  07/28/2019 Negative History of abnormal Pap:  no MMG:  11/24/2021 Negative Colonoscopy:  cologuard has just been received  BMD:   09/06/2020  Screening Labs: done with Dr. Mardelle Matte   reports that she has never smoked. She has never used smokeless tobacco. She reports current alcohol use. She reports that she does not use drugs.  Past Medical History:  Diagnosis Date   Allergy    Allergic rhinitis   Anxiety    Meniere's disease    Osteopenia    Post-menopause on HRT (hormone replacement therapy) 09/10/2018    Past Surgical History:  Procedure Laterality Date   CATARACT EXTRACTION Left 2021   ENDOLYMPHATIC SHUNT DECOMPRESSION  8/14   in [facility]   inner ear endolymphatic sac operation with shunt Left 2014   KNEE ARTHROSCOPY     right   KNEE SURGERY     TONSILLECTOMY      Current Outpatient Medications  Medication Sig Dispense Refill   ALPRAZolam (XANAX) 0.5 MG tablet Take 1 tablet (0.5 mg total) by mouth at bedtime as needed for anxiety (vertigo). 30 tablet 0    azelastine (ASTELIN) 0.1 % nasal spray daily as needed.  5   cetirizine (ZYRTEC) 10 MG tablet Take 10 mg by mouth daily.     Cholecalciferol (VITAMIN D3) 25 MCG (1000 UT) CAPS Take by mouth.     estradiol (ESTRACE) 0.5 MG tablet Take 1 tablet (0.5 mg total) by mouth daily. 90 tablet 0   potassium chloride (KLOR-CON M) 10 MEQ tablet Take 1 tablet (10 mEq total) by mouth 2 (two) times daily. 90 tablet 3   progesterone (PROMETRIUM) 100 MG capsule Take 1 capsule (100 mg total) by mouth daily. 90 capsule 0   triamterene-hydrochlorothiazide (DYAZIDE) 37.5-25 MG per capsule Take 1 capsule by mouth daily.     No current facility-administered medications for this visit.    Family History  Problem Relation Age of Onset   Cancer Father    Atrial fibrillation Father    Osteoporosis Mother    Breast cancer Neg Hx     ROS: Constitutional: negative Genitourinary:negative  Exam:   LMP 04/16/2008      General appearance: alert, cooperative and appears stated age Head: Normocephalic, without obvious abnormality, atraumatic Neck: no adenopathy, supple, symmetrical, trachea midline and thyroid normal to inspection and palpation Lungs: clear to auscultation bilaterally Breasts: normal appearance, no masses or tenderness Heart: regular rate and rhythm Abdomen: soft, non-tender;  bowel sounds normal; no masses,  no organomegaly Extremities: extremities normal, atraumatic, no cyanosis or edema Skin: Skin color, texture, turgor normal. No rashes or lesions Lymph nodes: Cervical, supraclavicular, and axillary nodes normal. No abnormal inguinal nodes palpated Neurologic: Grossly normal   Pelvic: External genitalia:  no lesions              Urethra:  normal appearing urethra with no masses, tenderness or lesions              Bartholins and Skenes: normal                 Vagina: normal appearing vagina with normal color and no discharge, no lesions              Cervix: no lesions              Pap  taken: No. Bimanual Exam:  Uterus:  normal size, contour, position, consistency, mobility, non-tender              Adnexa: normal adnexa and no mass, fullness, tenderness               Rectovaginal: Confirms               Anus:  normal sphincter tone, no lesions  Chaperone, Ina Homes, CMA, was present for exam.  Assessment/Plan: 1. Well woman exam with routine gynecological exam - Pap smear neg with neg HR HPV 2021.  Will repeat next year. - Mammogram 11/2021 - Cologuard ordered by Dr. Mardelle Matte - Bone mineral density done 2022.  Will repeat next year. - lab work done done with PCP - vaccines reviewed/updated  2. Post-menopause on HRT (hormone replacement therapy) - will continue current HRT dosing - estradiol (ESTRACE) 0.5 MG tablet; Take 1 tablet (0.5 mg total) by mouth daily.  Dispense: 90 tablet; Refill: 4 - progesterone (PROMETRIUM) 100 MG capsule; Take 1 capsule (100 mg total) by mouth daily.  Dispense: 90 capsule; Refill: 3  3. Urinary urgency - will start vaginal estrogen cream to see if will help. - estradiol (ESTRACE) 0.1 MG/GM vaginal cream; 1 gram vaginally twice weekly  Dispense: 42.5 g; Refill: 2 - Hydroxyzine for prn use given as well.  Discussed with pt referral to urology or urogyn as well as pelvic PT.  Elimination diet and trigger foods - hydrOXYzine (ATARAX) 10 MG tablet; Take 1 tablet (10 mg total) by mouth 3 (three) times daily as needed.  Dispense: 30 tablet; Refill: 0  4. Colon cancer screening - Cologuard  5. Osteopenia, unspecified location - will plan BMD next year  6. Anxiety - ALPRAZolam (XANAX) 0.5 MG tablet; Take 1 tablet (0.5 mg total) by mouth at bedtime as needed for anxiety (vertigo).  Dispense: 30 tablet; Refill: 0

## 2021-12-22 ENCOUNTER — Encounter: Payer: Self-pay | Admitting: Family Medicine

## 2021-12-22 ENCOUNTER — Encounter (HOSPITAL_BASED_OUTPATIENT_CLINIC_OR_DEPARTMENT_OTHER): Payer: Self-pay | Admitting: Obstetrics & Gynecology

## 2021-12-22 ENCOUNTER — Other Ambulatory Visit (HOSPITAL_BASED_OUTPATIENT_CLINIC_OR_DEPARTMENT_OTHER): Payer: Self-pay | Admitting: Obstetrics & Gynecology

## 2021-12-22 DIAGNOSIS — R350 Frequency of micturition: Secondary | ICD-10-CM

## 2022-01-08 ENCOUNTER — Encounter: Payer: Self-pay | Admitting: *Deleted

## 2022-01-10 LAB — COLOGUARD: COLOGUARD: NEGATIVE

## 2022-02-02 ENCOUNTER — Encounter: Payer: Self-pay | Admitting: Family Medicine

## 2022-02-02 DIAGNOSIS — E876 Hypokalemia: Secondary | ICD-10-CM

## 2022-02-21 ENCOUNTER — Other Ambulatory Visit (INDEPENDENT_AMBULATORY_CARE_PROVIDER_SITE_OTHER): Payer: Managed Care, Other (non HMO)

## 2022-02-21 DIAGNOSIS — T502X5A Adverse effect of carbonic-anhydrase inhibitors, benzothiadiazides and other diuretics, initial encounter: Secondary | ICD-10-CM | POA: Diagnosis not present

## 2022-02-21 DIAGNOSIS — E876 Hypokalemia: Secondary | ICD-10-CM

## 2022-02-21 LAB — MAGNESIUM: Magnesium: 2 mg/dL (ref 1.5–2.5)

## 2022-02-21 LAB — BASIC METABOLIC PANEL
BUN: 18 mg/dL (ref 6–23)
CO2: 35 mEq/L — ABNORMAL HIGH (ref 19–32)
Calcium: 9.7 mg/dL (ref 8.4–10.5)
Chloride: 99 mEq/L (ref 96–112)
Creatinine, Ser: 0.73 mg/dL (ref 0.40–1.20)
GFR: 88.76 mL/min (ref >60.00)
Glucose, Bld: 63 mg/dL — ABNORMAL LOW (ref 70–99)
Potassium: 3.9 mEq/L (ref 3.5–5.1)
Sodium: 139 mEq/L (ref 135–145)

## 2022-03-29 ENCOUNTER — Encounter: Payer: Self-pay | Admitting: *Deleted

## 2022-04-25 ENCOUNTER — Telehealth (HOSPITAL_BASED_OUTPATIENT_CLINIC_OR_DEPARTMENT_OTHER): Payer: Self-pay

## 2022-04-25 NOTE — Telephone Encounter (Signed)
Patient has called the office today to request a refill on Xanax 0.5mg  at bedtime, PRN. Please advise.  Patient was last seen 12/07/2021 for annual exam.  tbw

## 2022-04-26 ENCOUNTER — Other Ambulatory Visit (HOSPITAL_BASED_OUTPATIENT_CLINIC_OR_DEPARTMENT_OTHER): Payer: Self-pay | Admitting: Obstetrics & Gynecology

## 2022-04-26 DIAGNOSIS — F419 Anxiety disorder, unspecified: Secondary | ICD-10-CM

## 2022-04-26 MED ORDER — ALPRAZOLAM 0.5 MG PO TABS: 0.5000 mg | ORAL_TABLET | Freq: Every evening | ORAL | 0 refills | Status: DC | PRN

## 2022-05-28 ENCOUNTER — Encounter: Payer: Self-pay | Admitting: Obstetrics and Gynecology

## 2022-05-28 ENCOUNTER — Ambulatory Visit (INDEPENDENT_AMBULATORY_CARE_PROVIDER_SITE_OTHER): Payer: Managed Care, Other (non HMO) | Admitting: Obstetrics and Gynecology

## 2022-05-28 VITALS — BP 109/73 | HR 82 | Ht 62.0 in | Wt 112.0 lb

## 2022-05-28 DIAGNOSIS — R3989 Other symptoms and signs involving the genitourinary system: Secondary | ICD-10-CM

## 2022-05-28 DIAGNOSIS — R3915 Urgency of urination: Secondary | ICD-10-CM

## 2022-05-28 DIAGNOSIS — M62838 Other muscle spasm: Secondary | ICD-10-CM

## 2022-05-28 DIAGNOSIS — R350 Frequency of micturition: Secondary | ICD-10-CM

## 2022-05-28 LAB — POCT URINALYSIS DIPSTICK
Bilirubin, UA: NEGATIVE
Blood, UA: NEGATIVE
Glucose, UA: NEGATIVE
Ketones, UA: NEGATIVE
Leukocytes, UA: NEGATIVE
Nitrite, UA: NEGATIVE
Protein, UA: NEGATIVE
Spec Grav, UA: >=1.03 — AB (ref 1.010–1.025)
Urobilinogen, UA: 0.2 E.U./dL
pH, UA: 6 (ref 5.0–8.0)

## 2022-05-28 MED ORDER — CYCLOBENZAPRINE HCL 5 MG PO TABS: 5.0000 mg | ORAL_TABLET | Freq: Three times a day (TID) | ORAL | 1 refills | Status: DC | PRN

## 2022-05-28 NOTE — Patient Instructions (Addendum)
Today we talked about ways to manage bladder urgency such as altering your diet to avoid irritative beverages and foods (bladder diet) as well as attempting to decrease stress and other exacerbating factors.    The Most Bothersome Foods* The Least Bothersome Foods*  Coffee - Regular & Decaf Tea - caffeinated Carbonated beverages - cola, non-colas, diet & caffeine-free Alcohols - Beer, Red Wine, White Wine, Champagne Fruits - Grapefruit, [place], Orange, [employer] - Cranberry, Grapefruit, Orange, Pineapple Vegetables - Tomato & Tomato Products Flavor Enhancers - Hot peppers, Spicy foods, Chili, Horseradish, Vinegar, Monosodium glutamate (MSG) Artificial Sweeteners - NutraSweet, Sweet 'N Low, Equal (sweetener), Saccharin Ethnic foods - [place], [place], [place] food Express Scripts - low-fat & whole Fruits - Bananas, Blueberries, Honeydew melon, Pears, Raisins, Watermelon Vegetables - Broccoli, Brussels Sprouts, [place], Carrots, Cauliflower, [place], Cucumber, Mushrooms, Peas, Radishes, Squash, Zucchini, White potatoes, Sweet potatoes & yams Poultry - Chicken, Eggs, [place], [employer] - Beef, Programmer, multimedia, Lamb Seafood - Shrimp, [place] fish, Salmon Grains - Oat, Rice Snacks - Pretzels, Popcorn  *Lissa Morales et al. Diet and its role in interstitial cystitis/bladder pain syndrome (IC/BPS) and comorbid conditions. [place] 2012 Jan 11.    Interstitial Cystitis/ Bladder Pain treatment:  Avoid irriative foods and beverages and identify other triggers Work to decrease stress- meditation, yoga Medications for bladder pain: pyridium (Azo), methenamine, Uribel Medications: amitiptyline, hydroxyzine (anti-histamine), tums, L-arginine, Elmiron  Bladder instillations for pain flares Cystoscopy with hydrodistention  The IC Network at https://www.ic-network.com is helpful for bladder diet suggestions and forums for support.  Additional treatments to try for relief of IC symptoms:  Over  the counter natural supplements: -Bladder Ease by Vitanica, Bladder Rest or Bladder Builder (all contain L-arginine) - helps to rebuild protective bladder layer and reduce bladder pain -Marshmallow Root (relieves inflammation in the urinary tract) - pills available or drink as a tea -Aloe Vera: [place] Harvest Aloe Vera capsules,  AloePath (also contains L-arginine and calcium cabonate)- can take several months to see improvement -Fish oil - 4 g daily -Tumeric (standardized to 95% curcuminoids) - 500 mg three times daily

## 2022-05-28 NOTE — Progress Notes (Unsigned)
[facility] New Patient Evaluation and Consultation  Referring Provider: Megan Salon, MD PCP: Leamon Arnt, MD Date of Service: 05/28/2022  SUBJECTIVE Chief Complaint: New Patient (Initial Visit) Jody Clarke is a 62 y.o. female here for a consult for recurrent UTI sx. Pt said her bladder feels inflamed. )  History of Present Illness: Jody Clarke is a 62 y.o. White or Caucasian female seen in consultation at the request of Dr. Sabra Heck for evaluation of urinary urgency.    Review of records from Dr Sabra Heck significant for: Has urinary urgency issues like a UTI but cultures have been negative. Started on hydroxyzine and vaginal estrogen for symptoms.   Urinary Symptoms: Leaks urine with cough/ sneeze and laughing Unsure how often she is leaking.  Pad use:  liners/ mini-pads  She is not bothered by her UI symptoms.  Day time voids- every 2 hours, sometimes more.  Nocturia: 2-3 times per night to void. Voiding dysfunction: she empties her bladder well.  does not use a catheter to empty bladder.  When urinating, she feels the need to urinate multiple times in a row Drinks: 1.5 cup coffee, about 32oz water per day (sometimes more with exercise). Has stopped drinking tea after 2pm.  Has not seen any correlation with foods or drinks  UTIs:  0  UTI's in the last year but does have symptoms of UTI. Symptoms include: dysuria, pressure on bladder, frequent urination. Symptoms are on and off.  She did not try the hydroxyzine because she was concerned about her dry eye symptoms.  She is taking zyrtec daily for her allergies.  She has noticed some flares with more stressful situation like travel.  Denies history of blood in urine and kidney or bladder stones  She is using summer's eve nonscented.  Pelvic Organ Prolapse Symptoms:                  She Denies a feeling of a bulge the vaginal area.   Bowel Symptom: Bowel movements: 1 time(s) per day Stool  consistency: soft  Straining: no.  Splinting: no.  Incomplete evacuation: no.  She Denies accidental bowel leakage / fecal incontinence Bowel regimen: none   Sexual Function Sexually active: yes.  Pain with sex: has discomfort due to dryness  Pelvic Pain Denies pelvic pain   Past Medical History:  Past Medical History:  Diagnosis Date   Allergy    Allergic rhinitis   Anxiety    Meniere's disease    Osteopenia    Post-menopause on HRT (hormone replacement therapy) 09/10/2018     Past Surgical History:   Past Surgical History:  Procedure Laterality Date   CATARACT EXTRACTION Left 2021   ENDOLYMPHATIC SHUNT DECOMPRESSION  8/14   in [place]   inner ear endolymphatic sac operation with shunt Left 2014   KNEE ARTHROSCOPY     right   KNEE SURGERY     TONSILLECTOMY       Past OB/GYN History: OB History  Gravida Para Term Preterm AB Living  2 1 1 $ 0 1 1  SAB IAB Ectopic Multiple Live Births  1 0 0   1    # Outcome Date GA Lbr Len/2nd Weight Sex Delivery Anes PTL Lv  2 Term 67    F Vag-Spont     1 SAB            Menopausal: Denies vaginal bleeding since menopause Last pap smear was 11/2021- negative.    Medications:  She has a current medication list which includes the following prescription(s): alprazolam, azelastine, cetirizine, vitamin d3, cyclobenzaprine, estradiol, estradiol, hydroxyzine, progesterone, and triamterene-hydrochlorothiazide.   Allergies: Patient is allergic to sulfa antibiotics and ciprofloxacin.   Social History:  Social History   Tobacco Use   Smoking status: Never   Smokeless tobacco: Never  Vaping Use   Vaping Use: Never used  Substance Use Topics   Alcohol use: Yes    Alcohol/week: 0.0 - 1.0 standard drinks of alcohol   Drug use: No    Relationship status: married She lives with her spouse.   She is not employed. Regular exercise: Yes:   History of abuse: No  Family History:   Family History  Problem Relation Age of  Onset   Osteoporosis Mother    Cancer Father    Atrial fibrillation Father    Breast cancer Neg Hx      Review of Systems: Review of Systems  Constitutional:  Negative for fever, malaise/fatigue and weight loss.  Respiratory:  Negative for cough, shortness of breath and wheezing.   Cardiovascular:  Negative for chest pain, palpitations and leg swelling.  Gastrointestinal:  Negative for abdominal pain and blood in stool.  Genitourinary:  Negative for dysuria.  Musculoskeletal:  Negative for myalgias.  Skin:  Negative for rash.  Neurological:  Negative for dizziness and headaches.  Endo/Heme/Allergies:  Does not bruise/bleed easily.  Psychiatric/Behavioral:  Negative for depression. The patient is not nervous/anxious.      OBJECTIVE Physical Exam: Vitals:   05/28/22 1407  BP: 109/73  Pulse: 82  Weight: 112 lb (50.8 kg)  Height: 5' 2"$  (1.575 m)    Physical Exam Constitutional:      General: She is not in acute distress. Pulmonary:     Effort: Pulmonary effort is normal.  Abdominal:     General: There is no distension.     Palpations: Abdomen is soft.     Tenderness: There is no abdominal tenderness. There is no rebound.  Musculoskeletal:        General: No swelling. Normal range of motion.  Skin:    General: Skin is warm and dry.     Findings: No rash.  Neurological:     Mental Status: She is alert and oriented to person, place, and time.  Psychiatric:        Mood and Affect: Mood normal.        Behavior: Behavior normal.      GU / Detailed Urogynecologic Evaluation:  Pelvic Exam: Normal external female genitalia; Bartholin's and Skene's glands normal in appearance; urethral meatus normal in appearance, no urethral masses or discharge.   CST: negative  Speculum exam reveals normal vaginal mucosa with atrophy. Cervix normal appearance. Uterus normal single, nontender. Adnexa no mass, fullness, tenderness.     Pelvic floor strength I/V  Pelvic floor  musculature: Right levator tender, Right obturator tender, Left levator tender, Left obturator tender  POP-Q:   POP-Q  -3                                            Aa   -3                                           Ba  -  6.5                                              C   2                                            Gh  3.5                                            Pb  8                                            tvl   -3                                            Ap  -3                                            Bp  -8                                              D      Rectal Exam:  Normal external rectum  Post-Void Residual (PVR) by Bladder Scan: In order to evaluate bladder emptying, we discussed obtaining a postvoid residual and she agreed to this procedure.  Procedure: The ultrasound unit was placed on the patient's abdomen in the suprapubic region after the patient had voided. A PVR of 1 ml was obtained by bladder scan.  Laboratory Results: POC urine: negative   ASSESSMENT AND PLAN Jody Clarke is a 62 y.o. with:  1. Urinary frequency   2. Urinary urgency   3. Bladder pain   4. Levator spasm    Bladder pain - For irritative bladder we reviewed treatment options including altering her diet to avoid irritative beverages and foods as well as attempting to decrease stress and other exacerbating factors.  We also discussed using pyridium and similar over-the-counter medications for pain relief as needed. We discussed the pentad of medications including Elmiron, Tums, an antihistamine such as Vistaril, amitriptyline, and L-arginine.  We also discussed in-office bladder instillations for pain flares, as well as cystoscopy with hydrodistention in the operating room, which can be both diagnostic and therapeutic. - continue with zyrtec daily and will try an l-arginine supplement.  - avoid triggers/ stress management  2. Levator spasm - The origin of pelvic floor  muscle spasm can be multifactorial, including primary, reactive to a different pain source, trauma, or even part of a centralized pain syndrome.Treatment options include pelvic floor physical therapy, local (vaginal) or oral  muscle relaxants, pelvic muscle trigger point injections or centrally acting pain medications.   - Prescribed  flexeril 27m up to three times daily as needed - pelvic PT referral placed.   3. Urgency/ frequency - We discussed the symptoms of overactive bladder (OAB), which include urinary urgency, urinary frequency, nocturia, with or without urge incontinence.  While we do not know the exact etiology of OAB, several treatment options exist. We discussed management including behavioral therapy (decreasing bladder irritants, urge suppression strategies, timed voids, bladder retraining), physical therapy, medication; for refractory cases posterior tibial nerve stimulation, sacral neuromodulation, and intravesical botulinum toxin injection.  -She does not want to try a medication at this time. Discussed avoiding bladder irritants.    6 month follow up or sooner if needed   MJaquita Folds MD

## 2022-06-04 ENCOUNTER — Encounter: Payer: Self-pay | Admitting: *Deleted

## 2022-10-01 ENCOUNTER — Other Ambulatory Visit (HOSPITAL_BASED_OUTPATIENT_CLINIC_OR_DEPARTMENT_OTHER): Payer: Self-pay | Admitting: *Deleted

## 2022-10-01 DIAGNOSIS — F419 Anxiety disorder, unspecified: Secondary | ICD-10-CM

## 2022-10-01 NOTE — Telephone Encounter (Signed)
Pt called requesting refill on xanax. Advised that I would send her request to provider.

## 2022-10-03 MED ORDER — ALPRAZOLAM 0.5 MG PO TABS: 0.5000 mg | ORAL_TABLET | Freq: Every evening | ORAL | 0 refills | Status: DC | PRN

## 2022-11-13 ENCOUNTER — Other Ambulatory Visit (HOSPITAL_BASED_OUTPATIENT_CLINIC_OR_DEPARTMENT_OTHER): Payer: Self-pay | Admitting: Obstetrics & Gynecology

## 2022-11-13 DIAGNOSIS — Z1231 Encounter for screening mammogram for malignant neoplasm of breast: Secondary | ICD-10-CM

## 2022-11-14 ENCOUNTER — Encounter (INDEPENDENT_AMBULATORY_CARE_PROVIDER_SITE_OTHER): Payer: Self-pay

## 2022-11-30 ENCOUNTER — Other Ambulatory Visit (HOSPITAL_BASED_OUTPATIENT_CLINIC_OR_DEPARTMENT_OTHER): Payer: Self-pay | Admitting: Obstetrics & Gynecology

## 2022-11-30 DIAGNOSIS — Z79890 Hormone replacement therapy (postmenopausal): Secondary | ICD-10-CM

## 2022-12-04 ENCOUNTER — Ambulatory Visit: Payer: Managed Care, Other (non HMO) | Admitting: Obstetrics and Gynecology

## 2022-12-11 ENCOUNTER — Encounter: Payer: Managed Care, Other (non HMO) | Admitting: Family Medicine

## 2022-12-13 ENCOUNTER — Ambulatory Visit (HOSPITAL_BASED_OUTPATIENT_CLINIC_OR_DEPARTMENT_OTHER): Payer: Managed Care, Other (non HMO) | Admitting: Obstetrics & Gynecology

## 2022-12-20 ENCOUNTER — Inpatient Hospital Stay (HOSPITAL_BASED_OUTPATIENT_CLINIC_OR_DEPARTMENT_OTHER)
Admission: RE | Admit: 2022-12-20 | Payer: Managed Care, Other (non HMO) | Source: Ambulatory Visit | Admitting: Radiology

## 2023-01-07 ENCOUNTER — Telehealth: Payer: Self-pay | Admitting: Family Medicine

## 2023-01-07 DIAGNOSIS — Z0000 Encounter for general adult medical examination without abnormal findings: Secondary | ICD-10-CM

## 2023-01-07 NOTE — Telephone Encounter (Signed)
Patient called and rescheduled CPE for 03/06/23. Patient is wanting to complete labs prior to CPE if possible. Please Advise.

## 2023-01-07 NOTE — Telephone Encounter (Signed)
Future labs ordered.  Please notify patient to call and set up lab appointment prior to CPE.  Approximately 1 week before.  Thanks

## 2023-01-08 ENCOUNTER — Ambulatory Visit: Payer: Managed Care, Other (non HMO) | Admitting: Allergy & Immunology

## 2023-01-08 NOTE — Telephone Encounter (Signed)
Called and informed patient. Pt verbalized understanding and states she will callback to schedule lab only visit.

## 2023-01-13 NOTE — Progress Notes (Unsigned)
New Patient Note  RE: Jody Clarke MRN: [id number] DOB: [birth year] Date of Office Visit: 01/14/2023  Consult requested by: Willow Ora, MD Primary care provider: Willow Ora, MD  Chief Complaint: No chief complaint on file.  History of Present Illness: I had the pleasure of seeing Jody Clarke for initial evaluation at the [facility] on 01/13/2023. She is a 62 y.o. female, who is referred here by Willow Ora, MD for the evaluation of allergic rhinitis.  Discussed the use of AI scribe software for clinical note transcription with the patient, who gave verbal consent to proceed.  History of Present Illness             She reports symptoms of ***. Symptoms have been going on for *** years. The symptoms are present *** all year around with worsening in ***. Other triggers include exposure to ***. Anosmia: ***. Headache: ***. She has used *** with ***fair improvement in symptoms. Sinus infections: ***. Previous work up includes: ***. Previous ENT evaluation: ***. Previous sinus imaging: ***. History of nasal polyps: ***. Last eye exam: ***. History of reflux: ***.  Assessment and Plan: Jody Clarke is a 62 y.o. female with: ***  Assessment and Plan               No follow-ups on file.  No orders of the defined types were placed in this encounter.  Lab Orders  No laboratory test(s) ordered today    Other allergy screening: Asthma: {Blank single:19197::"yes","no"} Rhino conjunctivitis: {Blank single:19197::"yes","no"} Food allergy: {Blank single:19197::"yes","no"} Medication allergy: {Blank single:19197::"yes","no"} Hymenoptera allergy: {Blank single:19197::"yes","no"} Urticaria: {Blank single:19197::"yes","no"} Eczema:{Blank single:19197::"yes","no"} History of recurrent infections suggestive of immunodeficency: {Blank single:19197::"yes","no"}  Diagnostics: Spirometry:  Tracings reviewed. Her effort: {Blank single:19197::"Good  reproducible efforts.","It was hard to get consistent efforts and there is a question as to whether this reflects a maximal maneuver.","Poor effort, data can not be interpreted."} FVC: ***L FEV1: ***L, ***% predicted FEV1/FVC ratio: ***% Interpretation: {Blank single:19197::"Spirometry consistent with mild obstructive disease","Spirometry consistent with moderate obstructive disease","Spirometry consistent with severe obstructive disease","Spirometry consistent with possible restrictive disease","Spirometry consistent with mixed obstructive and restrictive disease","Spirometry uninterpretable due to technique","Spirometry consistent with normal pattern","No overt abnormalities noted given today's efforts"}.  Please see scanned spirometry results for details.  Skin Testing: {Blank single:19197::"Select foods","Environmental allergy panel","Environmental allergy panel and select foods","Food allergy panel","None","Deferred due to recent antihistamines use"}. *** Results discussed with patient/family.   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   Insomnia 05/03/2015   Vitamin D deficiency 05/03/2015   Deviated nasal septum 05/03/2015   Past Medical History:  Diagnosis Date   Allergy    Allergic rhinitis   Anxiety    Meniere's disease    Osteopenia    Post-menopause on HRT (hormone replacement therapy) 09/10/2018   Past Surgical History: Past Surgical History:  Procedure Laterality Date   CATARACT EXTRACTION Left 2021   ENDOLYMPHATIC SHUNT DECOMPRESSION  8/14   in [place]   inner ear endolymphatic sac operation with shunt Left  2014   KNEE ARTHROSCOPY     right   KNEE SURGERY     TONSILLECTOMY     Medication List:  Current Outpatient  Medications  Medication Sig Dispense Refill   ALPRAZolam (XANAX) 0.5 MG tablet Take 1 tablet (0.5 mg total) by mouth at bedtime as needed for anxiety (vertigo). 30 tablet 0   azelastine (ASTELIN) 0.1 % nasal spray daily as needed.  5   cetirizine (ZYRTEC) 10 MG tablet Take 10 mg by mouth daily.     Cholecalciferol (VITAMIN D3) 25 MCG (1000 UT) CAPS Take by mouth.     cyclobenzaprine (FLEXERIL) 5 MG tablet Take 1 tablet (5 mg total) by mouth 3 (three) times daily as needed for muscle spasms. 60 tablet 1   estradiol (ESTRACE) 0.1 MG/GM vaginal cream 1 gram vaginally twice weekly 42.5 g 2   estradiol (ESTRACE) 0.5 MG tablet Take 1 tablet (0.5 mg total) by mouth daily. 90 tablet 4   hydrOXYzine (ATARAX) 10 MG tablet Take 1 tablet (10 mg total) by mouth 3 (three) times daily as needed. 30 tablet 0   progesterone (PROMETRIUM) 100 MG capsule TAKE 1 CAPSULE BY MOUTH EVERY DAY 90 capsule 0   triamterene-hydrochlorothiazide (DYAZIDE) 37.5-25 MG per capsule Take 1 capsule by mouth daily.     No current facility-administered medications for this visit.   Allergies: Allergies  Allergen Reactions   Sulfa Antibiotics Other (See Comments)   Ciprofloxacin     Agitation/ tachy   Social History: Social History   Socioeconomic History   Marital status: Married    Spouse name: Not on file   Number of children: Not on file   Years of education: Not on file   Highest education level: Not on file  Occupational History   Not on file  Tobacco Use   Smoking status: Never   Smokeless tobacco: Never  Vaping Use   Vaping status: Never Used  Substance and Sexual Activity   Alcohol use: Yes    Alcohol/week: 0.0 - 1.0 standard drinks of alcohol   Drug use: No   Sexual activity: Yes    Birth control/protection: Post-menopausal  Other Topics Concern   Not on file  Social History Narrative    ** Merged History Encounter **       Social Determinants of Health   Financial Resource Strain: Not on file  Food Insecurity: Low Risk  (07/31/2022)   [facility]   Hunger Vital Sign    Worried About Running Out of Food in the Last Year: Never true    Ran Out of Food in the Last Year: Never true  Transportation Needs: No Transportation Needs (07/31/2022)   [facility]   Transportation    In the past 12 months, has lack of reliable transportation kept you from medical appointments, meetings, work or from getting things needed for daily living? : No  Physical Activity: Not on file  Stress: Not on file  Social Connections: Not on file   Lives in a ***. Smoking: *** Occupation: ***  Environmental HistorySurveyor, minerals in the house: Copywriter, advertising in the family room: {Blank single:19197::"yes","no"} Carpet in the bedroom: {Blank single:19197::"yes","no"} Heating: {Blank single:19197::"electric","gas","heat pump"} Cooling: {Blank single:19197::"central","window","heat pump"} Pet: {Blank single:19197::"yes ***","no"}  Family History: Family History  Problem Relation Age of Onset   Osteoporosis Mother    Cancer Father    Atrial fibrillation Father    Breast cancer Neg Hx    Problem  Relation Asthma                                   *** Eczema                                *** Food allergy                          *** Allergic rhino conjunctivitis     ***  Review of Systems  Constitutional:  Negative for appetite change, chills, fever and unexpected weight change.  HENT:  Negative for congestion and rhinorrhea.   Eyes:  Negative for itching.  Respiratory:  Negative for cough, chest tightness, shortness of breath and wheezing.   Cardiovascular:  Negative for chest pain.  Gastrointestinal:  Negative for abdominal pain.  Genitourinary:  Negative for  difficulty urinating.  Skin:  Negative for rash.  Neurological:  Negative for headaches.    Objective: LMP 04/16/2008  There is no height or weight on file to calculate BMI. Physical Exam Vitals and nursing note reviewed.  Constitutional:      Appearance: Normal appearance. She is well-developed.  HENT:     Head: Normocephalic and atraumatic.     Right Ear: Tympanic membrane and external ear normal.     Left Ear: Tympanic membrane and external ear normal.     Nose: Nose normal.     Mouth/Throat:     Mouth: Mucous membranes are moist.     Pharynx: Oropharynx is clear.  Eyes:     Conjunctiva/sclera: Conjunctivae normal.  Cardiovascular:     Rate and Rhythm: Normal rate and regular rhythm.     Heart sounds: Normal heart sounds. No murmur heard.    No friction rub. No gallop.  Pulmonary:     Effort: Pulmonary effort is normal.     Breath sounds: Normal breath sounds. No wheezing, rhonchi or rales.  Musculoskeletal:     Cervical back: Neck supple.  Skin:    General: Skin is warm.     Findings: No rash.  Neurological:     Mental Status: She is alert and oriented to person, place, and time.  Psychiatric:        Behavior: Behavior normal.    The plan was reviewed with the patient/family, and all questions/concerned were addressed.  It was my pleasure to see Jody Clarke today and participate in her care. Please feel free to contact me with any questions or concerns.  Sincerely,  Wyline Mood, DO Allergy & Immunology  [facility] office: [phone] [facility] [place] office: [phone]

## 2023-01-14 ENCOUNTER — Ambulatory Visit (INDEPENDENT_AMBULATORY_CARE_PROVIDER_SITE_OTHER): Payer: Managed Care, Other (non HMO) | Admitting: Allergy

## 2023-01-14 ENCOUNTER — Encounter: Payer: Self-pay | Admitting: Allergy

## 2023-01-14 ENCOUNTER — Other Ambulatory Visit: Payer: Self-pay

## 2023-01-14 VITALS — BP 126/74 | HR 89 | Temp 98.5°F | Resp 18 | Ht 62.21 in | Wt 110.5 lb

## 2023-01-14 DIAGNOSIS — J3089 Other allergic rhinitis: Secondary | ICD-10-CM | POA: Diagnosis not present

## 2023-01-14 DIAGNOSIS — H1013 Acute atopic conjunctivitis, bilateral: Secondary | ICD-10-CM | POA: Diagnosis not present

## 2023-01-14 DIAGNOSIS — R0602 Shortness of breath: Secondary | ICD-10-CM | POA: Diagnosis not present

## 2023-01-14 DIAGNOSIS — H04123 Dry eye syndrome of bilateral lacrimal glands: Secondary | ICD-10-CM

## 2023-01-14 DIAGNOSIS — J301 Allergic rhinitis due to pollen: Secondary | ICD-10-CM

## 2023-01-14 MED ORDER — MONTELUKAST SODIUM 10 MG PO TABS: 10.0000 mg | ORAL_TABLET | Freq: Every day | ORAL | 3 refills | Status: DC

## 2023-01-14 NOTE — Patient Instructions (Addendum)
Today's skin testing positive to tree pollen only.  Results given.  Environmental allergies Start environmental control measures as below. Tree pollen allergy does not explain your perennial symptoms.  Consider getting bloodwork next.  Use over the counter antihistamines such as Zyrtec (cetirizine), Claritin (loratadine), Allegra (fexofenadine), or Xyzal (levocetirizine) daily as needed. May switch antihistamines every few months. Start Singulair (montelukast) 10mg  daily at night. Cautioned that in some children/adults can experience behavioral changes including hyperactivity, agitation, depression, sleep disturbances and suicidal ideations. These side effects are rare, but if you notice them you should notify me and discontinue Singulair (montelukast). Use Flonase (fluticasone) nasal spray 1-2 sprays per nostril once a day as needed for nasal congestion.  Nasal saline spray (i.e., Simply Saline) or nasal saline lavage (i.e., NeilMed) is recommended as needed and prior to medicated nasal sprays. Use azelastine nasal spray 1-2 sprays per nostril twice a day as needed for runny nose/drainage  Use sparingly as it can worsen dry eyes.   Dry eyes Follow up with your eye doctor - ask about other dry eye treatments.  Limit antihistamine pill and nasal spray use. May use refresh eye drops as needed.  Ask your ENT about lowering the hctz (diuretic) component of the dyazide pill.  Shortness of breath Normal breathing test today. Monitor symptoms.  Return in about 2 months (around 03/16/2023). Or sooner if needed.   Reducing Pollen Exposure Pollen seasons: trees (spring), grass (summer) and ragweed/weeds (fall). Keep windows closed in your home and car to lower pollen exposure.  Install air conditioning in the bedroom and throughout the house if possible.  Avoid going out in dry windy days - especially early morning. Pollen counts are highest between 5 - 10 AM and on dry, hot and windy days.   Save outside activities for late afternoon or after a heavy rain, when pollen levels are lower.  Avoid mowing of grass if you have grass pollen allergy. Be aware that pollen can also be transported indoors on people and pets.  Dry your clothes in an automatic dryer rather than hanging them outside where they might collect pollen.  Rinse hair and eyes before bedtime.

## 2023-01-17 ENCOUNTER — Ambulatory Visit (HOSPITAL_BASED_OUTPATIENT_CLINIC_OR_DEPARTMENT_OTHER): Payer: Managed Care, Other (non HMO) | Admitting: Radiology

## 2023-01-18 ENCOUNTER — Inpatient Hospital Stay (HOSPITAL_BASED_OUTPATIENT_CLINIC_OR_DEPARTMENT_OTHER)
Admission: RE | Admit: 2023-01-18 | Payer: Managed Care, Other (non HMO) | Source: Ambulatory Visit | Admitting: Radiology

## 2023-01-25 ENCOUNTER — Encounter (HOSPITAL_BASED_OUTPATIENT_CLINIC_OR_DEPARTMENT_OTHER): Payer: Self-pay | Admitting: Obstetrics & Gynecology

## 2023-02-02 ENCOUNTER — Ambulatory Visit (HOSPITAL_BASED_OUTPATIENT_CLINIC_OR_DEPARTMENT_OTHER): Payer: Managed Care, Other (non HMO) | Admitting: Radiology

## 2023-02-08 ENCOUNTER — Encounter: Payer: Self-pay | Admitting: Allergy

## 2023-02-19 ENCOUNTER — Other Ambulatory Visit (HOSPITAL_BASED_OUTPATIENT_CLINIC_OR_DEPARTMENT_OTHER): Payer: Self-pay | Admitting: Obstetrics & Gynecology

## 2023-02-19 ENCOUNTER — Telehealth (HOSPITAL_BASED_OUTPATIENT_CLINIC_OR_DEPARTMENT_OTHER): Payer: Self-pay

## 2023-02-19 DIAGNOSIS — F419 Anxiety disorder, unspecified: Secondary | ICD-10-CM

## 2023-02-19 MED ORDER — ALPRAZOLAM 0.5 MG PO TABS
0.5000 mg | ORAL_TABLET | Freq: Every evening | ORAL | 0 refills | Status: DC | PRN
Start: 2023-02-19 — End: 2023-05-13

## 2023-02-19 NOTE — Telephone Encounter (Signed)
Pt called and requested refill on Alprazolam (xanax) 0.5 mg tablet.

## 2023-02-19 NOTE — Telephone Encounter (Signed)
Patient has been informed of possibility that labs will not be resulted by the time of CPE if drawn the day before. Pt verbalized understanding and has been schedule lab only for 11/19 @ 8:45 am.

## 2023-02-19 NOTE — Telephone Encounter (Signed)
Patient advised prior labs be done approx. 1 week prior to CPE-per Dr. Mardelle Matte.   Patient requests to be advised if she can have prior labs done 03/05/23 (Day before CPE-03/06/23)

## 2023-03-03 ENCOUNTER — Other Ambulatory Visit (HOSPITAL_BASED_OUTPATIENT_CLINIC_OR_DEPARTMENT_OTHER): Payer: Self-pay | Admitting: Obstetrics & Gynecology

## 2023-03-03 DIAGNOSIS — Z79890 Hormone replacement therapy (postmenopausal): Secondary | ICD-10-CM

## 2023-03-05 ENCOUNTER — Other Ambulatory Visit (INDEPENDENT_AMBULATORY_CARE_PROVIDER_SITE_OTHER): Payer: Managed Care, Other (non HMO)

## 2023-03-05 ENCOUNTER — Ambulatory Visit: Payer: Managed Care, Other (non HMO)

## 2023-03-05 DIAGNOSIS — Z0000 Encounter for general adult medical examination without abnormal findings: Secondary | ICD-10-CM

## 2023-03-05 DIAGNOSIS — Z13220 Encounter for screening for lipoid disorders: Secondary | ICD-10-CM

## 2023-03-05 LAB — CBC WITH DIFFERENTIAL/PLATELET
Basophils Absolute: 0 10*3/uL (ref 0.0–0.1)
Basophils Relative: 0.8 % (ref 0.0–3.0)
Eosinophils Absolute: 0.2 10*3/uL (ref 0.0–0.7)
Eosinophils Relative: 5.2 % — ABNORMAL HIGH (ref 0.0–5.0)
HCT: 43.7 % (ref 36.0–46.0)
Hemoglobin: 14.8 g/dL (ref 12.0–15.0)
Lymphocytes Relative: 33.5 % (ref 12.0–46.0)
Lymphs Abs: 1 10*3/uL (ref 0.7–4.0)
MCHC: 33.8 g/dL (ref 30.0–36.0)
MCV: 94.2 fL (ref 78.0–100.0)
Monocytes Absolute: 0.3 10*3/uL (ref 0.1–1.0)
Monocytes Relative: 9.2 % (ref 3.0–12.0)
Neutro Abs: 1.6 10*3/uL (ref 1.4–7.7)
Neutrophils Relative %: 51.3 % (ref 43.0–77.0)
Platelets: 293 10*3/uL (ref 150.0–400.0)
RBC: 4.64 Mil/uL (ref 3.87–5.11)
RDW: 12.9 % (ref 11.5–15.5)
WBC: 3.1 10*3/uL — ABNORMAL LOW (ref 4.0–10.5)

## 2023-03-05 LAB — LIPID PANEL
Cholesterol: 191 mg/dL (ref 0–200)
HDL: 56.1 mg/dL (ref >39.00)
LDL Cholesterol: 125 mg/dL — ABNORMAL HIGH (ref 0–99)
NonHDL: 135.04
Total CHOL/HDL Ratio: 3
Triglycerides: 52 mg/dL (ref 0.0–149.0)
VLDL: 10.4 mg/dL (ref 0.0–40.0)

## 2023-03-05 LAB — COMPREHENSIVE METABOLIC PANEL
ALT: 13 U/L (ref 0–35)
AST: 20 U/L (ref 0–37)
Albumin: 4.6 g/dL (ref 3.5–5.2)
Alkaline Phosphatase: 44 U/L (ref 39–117)
BUN: 22 mg/dL (ref 6–23)
CO2: 32 meq/L (ref 19–32)
Calcium: 9.6 mg/dL (ref 8.4–10.5)
Chloride: 98 meq/L (ref 96–112)
Creatinine, Ser: 0.77 mg/dL (ref 0.40–1.20)
GFR: 82.66 mL/min (ref >60.00)
Glucose, Bld: 86 mg/dL (ref 70–99)
Potassium: 3.6 meq/L (ref 3.5–5.1)
Sodium: 137 meq/L (ref 135–145)
Total Bilirubin: 0.6 mg/dL (ref 0.2–1.2)
Total Protein: 7.4 g/dL (ref 6.0–8.3)

## 2023-03-05 LAB — VITAMIN D 25 HYDROXY (VIT D DEFICIENCY, FRACTURES): VITD: 38.64 ng/mL (ref 30.00–100.00)

## 2023-03-05 LAB — TSH: TSH: 2.33 u[IU]/mL (ref 0.35–5.50)

## 2023-03-06 ENCOUNTER — Encounter: Payer: Self-pay | Admitting: Family Medicine

## 2023-03-06 ENCOUNTER — Ambulatory Visit (INDEPENDENT_AMBULATORY_CARE_PROVIDER_SITE_OTHER): Payer: Managed Care, Other (non HMO) | Admitting: Family Medicine

## 2023-03-06 VITALS — BP 100/62 | HR 89 | Temp 98.3°F | Ht 62.21 in | Wt 110.0 lb

## 2023-03-06 DIAGNOSIS — Z0000 Encounter for general adult medical examination without abnormal findings: Secondary | ICD-10-CM | POA: Diagnosis not present

## 2023-03-06 DIAGNOSIS — Z79890 Hormone replacement therapy (postmenopausal): Secondary | ICD-10-CM

## 2023-03-06 DIAGNOSIS — Z780 Asymptomatic menopausal state: Secondary | ICD-10-CM

## 2023-03-06 DIAGNOSIS — E559 Vitamin D deficiency, unspecified: Secondary | ICD-10-CM | POA: Diagnosis not present

## 2023-03-06 DIAGNOSIS — F4323 Adjustment disorder with mixed anxiety and depressed mood: Secondary | ICD-10-CM | POA: Diagnosis not present

## 2023-03-06 DIAGNOSIS — H8103 Meniere's disease, bilateral: Secondary | ICD-10-CM

## 2023-03-06 DIAGNOSIS — M810 Age-related osteoporosis without current pathological fracture: Secondary | ICD-10-CM

## 2023-03-06 DIAGNOSIS — Z0001 Encounter for general adult medical examination with abnormal findings: Secondary | ICD-10-CM

## 2023-03-06 DIAGNOSIS — J309 Allergic rhinitis, unspecified: Secondary | ICD-10-CM

## 2023-03-06 NOTE — Progress Notes (Signed)
Subjective  Chief Complaint  Patient presents with   Annual Exam    Pt here for Annual Exam and is not currently fasting     HPI: Jody Clarke is a 62 y.o. female who presents to [facility] today for a Female Wellness Visit. She also has the concerns and/or needs as listed above in the chief complaint. These will be addressed in addition to the Health Maintenance Visit.   Wellness Visit: annual visit with health maintenance review and exam  Health maintenance: Mammogram scheduled for December.  Had lab work done yesterday fasting, reviewed.  See below.  Cologuard negative last year.  Defers Shingrix.  Declines flu vaccine Chronic disease f/u and/or acute problem visit: (deemed necessary to be done in addition to the wellness visit): Osteoporosis by bone density 2022.  Due for recheck.  Positive family history in mother Adjustment disorder with anxiety: He has 3-4 exacerbations per year.  Typically symptoms will spiral related to a certain trigger, symptoms will last 1 to 2 weeks.  She does get through them with intermittent use of Xanax.  She does not have daily symptoms.  No panic attacks.  No mood disorders. Allergies: Reviewed allergy notes.  On multiple medications.  Has follow-up in December. Mnire's disease: Stable on diuretic.  Recent potassium was low normal.  The 10-year ASCVD risk score (Arnett DK, et al., 2019) is: 2.4%   Values used to calculate the score:     Age: 10 years     Sex: Female     Is Non-Hispanic African American: No     Diabetic: No     Tobacco smoker: No     Systolic Blood Pressure: 100 mmHg     Is BP treated: No     HDL Cholesterol: 56.1 mg/dL     Total Cholesterol: 191 mg/dL  Assessment  1. Encounter for well adult exam with abnormal findings   2. Postmenopausal HRT (hormone replacement therapy)   3. Active cochleovestibular Meniere's disease of both ears   4. Adjustment disorder with anxiety   5. Vitamin D  deficiency   6. Chronic allergic rhinitis   7. Age-related osteoporosis without current pathological fracture   8. Asymptomatic menopausal state      Plan  Female Wellness Visit: Age appropriate Health Maintenance and Prevention measures were discussed with patient. Included topics are cancer screening recommendations, ways to keep healthy (see AVS) including dietary and exercise recommendations, regular eye and dental care, use of seat belts, and avoidance of moderate alcohol use and tobacco use.  Mammo scheduled for December.  Pap smear current.  Colorectal cancer screening current with negative Cologuard last year. BMI: discussed patient's BMI and encouraged positive lifestyle modifications to help get to or maintain a target BMI.  Exercises regularly HM needs and immunizations were addressed and ordered. See below for orders. See HM and immunization section for updates. Routine labs and screening tests ordered including cmp, cbc and lipids where appropriate. Discussed recommendations regarding Vit D and calcium supplementation (see AVS)  Chronic disease management visit and/or acute problem visit: Assessment & Plan Allergic Rhinitis Experiencing year-round symptoms. Singulair was ineffective. Follow-up with allergist scheduled for December to consider further testing and treatment options, including blood work for immunotherapy evaluation. - Follow up with allergist in December -Continue current medications  Osteoporosis Last bone density scan in 2022 indicated osteoporosis. Given family history, thin build, and Caucasian ethnicity, increased risk noted. Recommended rechecking bone density sooner than  the typical interval to monitor progression. Discussed the importance of using the same machine for accurate comparison of results, which could influence the decision on whether to start treatment. - Order bone density scan, start treatment if worsening. - Continue exercise and vitamin D and  calcium supplements - Discuss results with Dr. Hyacinth Meeker if not already ordered  Anxiety, adjustment Experiences anxiety episodes 3-4 times a year, lasting 1-2 weeks. Uses Xanax intermittently. Discussed the possibility of increasing estrogen dose to manage anxiety and sleep issues. Currently on a low dose of estrogen for about ten years. Increasing the dose for 2-3 months could help determine if symptoms are hormone-related. Emphasized monitoring the frequency and severity of anxiety symptoms. - Discuss increasing estrogen dose with Dr. Hyacinth Meeker, I believe she is a safe candidate for 1 mg nightly.  Continue progesterone. - Monitor frequency and severity of anxiety symptoms - prn xanax use is fine if rarely needed -If anxiety symptoms worsen or become more frequent, would recommend different treatment options.  Mnire's -Continue daily diuretic.  Will monitor for hypokalemia.  Could consider weekly supplements.  Recommend high potassium diet  General Health Maintenance Routine appointments scheduled, including a mammogram. Blood work showed a slightly low white blood count, which has been noted in the past. - Ensure mammogram is completed - Monitor white blood count in future blood work  Follow-up - Follow up with Dr. Hyacinth Meeker in December - Follow up with allergist in December. Follow up: 12 months for complete physical Orders Placed This Encounter  Procedures   DG Bone Density   No orders of the defined types were placed in this encounter.     Body mass index is 19.98 kg/m. Wt Readings from Last 3 Encounters:  03/06/23 110 lb (49.9 kg)  01/14/23 110 lb 8 oz (50.1 kg)  05/28/22 112 lb (50.8 kg)     Patient Active Problem List   Diagnosis Date Noted Date Diagnosed   Cochleovestibular active Meniere's disease 05/03/2015     Priority: High   Insomnia 05/03/2015     Priority: High   Osteoporosis 07/28/2020     Priority: Medium     08/2020 Dexa, Dr. Valentina [facility]  Measured Date Measured Age YA BMD Significant CHANGE T-score: osteoporosis, worsening from osteopenia in 2019; Dr. Hyacinth Meeker elects to recheck in 2-3 years.  DualFemur Neck Right 09/06/2020    59.9         -2.5    0.687 g/cm2 AP Spine  L1-L4      09/06/2020    59.9         -0.9    1.088 g/cm2 DualFemur Total Mean 09/06/2020    59.9         -2.4    0.708 g/cm2    Recurrent aphthous ulcer 07/03/2019     Priority: Medium    Postmenopausal HRT (hormone replacement therapy) 09/10/2018     Priority: Medium    Adjustment disorder with anxiety 05/03/2015     Priority: Medium     Prn xanax    Asymmetrical left sensorineural hearing loss 05/03/2015     Priority: Medium    Chronic allergic rhinitis 09/10/2018     Priority: Low   Vitamin D deficiency 05/03/2015     Priority: Low   Deviated nasal septum 05/03/2015     Priority: Low   Health Maintenance  Topic Date Due   DEXA SCAN  09/07/2022   MAMMOGRAM  11/25/2022   COVID-19 Vaccine (4 - 2023-24 season) 03/22/2023 (  Originally 12/16/2022)   Zoster Vaccines- Shingrix (1 of 2) 06/06/2023 (Originally 09/21/2010)   DTaP/Tdap/Td (1 - Tdap) 07/08/2023 (Originally 09/21/1979)   INFLUENZA VACCINE  07/15/2023 (Originally 11/15/2022)   Cervical Cancer Screening (HPV/Pap Cotest)  07/27/2024   Fecal DNA (Cologuard)  01/02/2025   Hepatitis C Screening  Completed   HPV VACCINES  Aged Out   HIV Screening  Discontinued   Immunization History  Administered Date(s) Administered   PFIZER(Purple Top)SARS-COV-2 Vaccination 07/15/2019, 07/28/2019, 04/20/2020   We updated and reviewed the patient's past history in detail and it is documented below. Allergies: Patient is allergic to sulfa antibiotics and ciprofloxacin. Past Medical History Patient  has a past medical history of Allergy, Anxiety, Meniere's disease, Osteopenia, and Post-menopause on HRT (hormone replacement therapy) (09/10/2018). 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 Family History: Patient family history includes Atrial fibrillation in her father; Cancer in her father; Osteoporosis in her mother. Social History:  Patient  reports that she has never smoked. She has never used smokeless tobacco. She reports current alcohol use. She reports that she does not use drugs.  Review of Systems: Constitutional: negative for fever or malaise Ophthalmic: negative for photophobia, double vision or loss of vision Cardiovascular: negative for chest pain, dyspnea on exertion, or new LE swelling Respiratory: negative for SOB or persistent cough Gastrointestinal: negative for abdominal pain, change in bowel habits or melena Genitourinary: negative for dysuria or gross hematuria, no abnormal uterine bleeding or disharge Musculoskeletal: negative for new gait disturbance or muscular weakness Integumentary: negative for new or persistent rashes, no breast lumps Neurological: negative for TIA or stroke symptoms Psychiatric: negative for SI or delusions Allergic/Immunologic: negative for hives  Patient Care Team    Relationship Specialty Notifications Start End  Willow Ora, MD PCP - General Family Medicine  09/10/18   Jerene Bears, MD Consulting Physician Gynecology  09/10/18   Christia Reading, MD Consulting Physician Otolaryngology  09/10/18   Venancio Poisson, MD Consulting Physician Dermatology  10/09/18   Ellamae Sia, DO Consulting Physician Allergy  03/06/23     Objective  Vitals: BP 100/62   Pulse 89   Temp 98.3 F (36.8 C)   Ht 5' 2.21" (1.58 m)   Wt 110 lb (49.9 kg)   LMP 04/16/2008   SpO2 97%   BMI 19.98 kg/m  General:  Well developed, well nourished, no acute distress, thin Psych:  Alert and orientedx3,normal mood and affect HEENT:  Normocephalic, atraumatic,  non-icteric sclera,  supple neck without adenopathy, mass or thyromegaly Cardiovascular:  Normal S1, S2, RRR without gallop, rub or murmur Respiratory:  Good breath sounds bilaterally, CTAB with normal respiratory effort Gastrointestinal: normal bowel sounds, soft, non-tender, no noted masses. No HSM MSK: extremities without edema, joints without erythema or swelling Neurologic:    Mental status is normal.  Gross motor and sensory exams are normal.  No tremor  No visits with results within 1 Day(s) from this visit.  Latest known visit with results is:  Lab on 03/05/2023  Component Date Value Ref Range Status   TSH 03/05/2023 2.33  0.35 - 5.50 uIU/mL Final   Cholesterol 03/05/2023 191  0 - 200 mg/dL Final   Triglycerides 84/69/6295 52.0  0.0 - 149.0 mg/dL Final   HDL 28/41/3244 56.10  >39.00 mg/dL Final   VLDL 04/18/7251 10.4  0.0 - 40.0 mg/dL Final   LDL Cholesterol 03/05/2023 125 (H)  0 - 99 mg/dL Final   Total CHOL/HDL Ratio 03/05/2023 3   Final   NonHDL 03/05/2023 135.04   Final   Sodium 03/05/2023 137  135 - 145 mEq/L Final   Potassium 03/05/2023 3.6  3.5 - 5.1 mEq/L Final   Chloride 03/05/2023 98  96 - 112 mEq/L Final   CO2 03/05/2023 32  19 - 32 mEq/L Final   Glucose, Bld 03/05/2023 86  70 - 99 mg/dL Final   BUN 25/42/7062 22  6 - 23 mg/dL Final   Creatinine, Ser 03/05/2023 0.77  0.40 - 1.20 mg/dL Final   Total Bilirubin 03/05/2023 0.6  0.2 - 1.2 mg/dL Final   Alkaline Phosphatase 03/05/2023 44  39 - 117 U/L Final   AST 03/05/2023 20  0 - 37 U/L Final   ALT 03/05/2023 13  0 - 35 U/L Final   Total Protein 03/05/2023 7.4  6.0 - 8.3 g/dL Final   Albumin 37/62/8315 4.6  3.5 - 5.2 g/dL Final   GFR 17/61/6073 82.66  >60.00 mL/min Final   Calcium 03/05/2023 9.6  8.4 - 10.5 mg/dL Final   WBC 71/09/2692 3.1 (L)  4.0 - 10.5 K/uL Final   RBC 03/05/2023 4.64  3.87 - 5.11 Mil/uL Final   Hemoglobin 03/05/2023 14.8  12.0 - 15.0 g/dL Final   HCT 85/46/2703 43.7  36.0 - 46.0 % Final   MCV  03/05/2023 94.2  78.0 - 100.0 fl Final   MCHC 03/05/2023 33.8  30.0 - 36.0 g/dL Final   RDW 50/12/3816 12.9  11.5 - 15.5 % Final   Platelets 03/05/2023 293.0  150.0 - 400.0 K/uL Final   Neutrophils Relative % 03/05/2023 51.3  43.0 - 77.0 % Final   Lymphocytes Relative 03/05/2023 33.5  12.0 - 46.0 % Final   Monocytes Relative 03/05/2023 9.2  3.0 - 12.0 % Final   Eosinophils Relative 03/05/2023 5.2 (H)  0.0 - 5.0 % Final   Basophils Relative 03/05/2023 0.8  0.0 - 3.0 % Final   Neutro Abs 03/05/2023 1.6  1.4 - 7.7 K/uL Final   Lymphs Abs 03/05/2023 1.0  0.7 - 4.0 K/uL Final   Monocytes Absolute 03/05/2023 0.3  0.1 - 1.0 K/uL Final   Eosinophils Absolute 03/05/2023 0.2  0.0 - 0.7 K/uL Final   Basophils Absolute 03/05/2023 0.0  0.0 - 0.1 K/uL Final   VITD 03/05/2023 38.64  30.00 - 100.00 ng/mL Final    Commons side effects, risks, benefits, and alternatives for medications and treatment plan prescribed today were discussed, and the patient expressed understanding of the given instructions. Patient is instructed to call or message via MyChart if he/she has any questions or concerns regarding our treatment plan. No barriers to understanding were identified. We discussed Red Flag symptoms and signs in detail. Patient expressed understanding regarding what to do in case of urgent or emergency type symptoms.  Medication list was reconciled, printed and provided to the patient in AVS. Patient instructions and summary information was reviewed with the patient as documented in the AVS. This note was prepared with assistance of Dragon voice recognition software. Occasional wrong-word or sound-a-like substitutions may have occurred due to the inherent limitations of voice recognition software

## 2023-03-11 ENCOUNTER — Ambulatory Visit
Admission: RE | Admit: 2023-03-11 | Discharge: 2023-03-11 | Disposition: A | Discharge status: Home / Self Care | Payer: Managed Care, Other (non HMO) | Source: Ambulatory Visit | Attending: Obstetrics & Gynecology | Admitting: Obstetrics & Gynecology

## 2023-03-11 DIAGNOSIS — Z1231 Encounter for screening mammogram for malignant neoplasm of breast: Secondary | ICD-10-CM

## 2023-03-11 IMAGING — MG MM DIGITAL SCREENING BILAT W/ TOMO AND CAD
8 series · 9 of 24 positions shown · non-contrast
Comparison: Previous exam(s).

CLINICAL DATA: Screening.

EXAM:
DIGITAL SCREENING BILATERAL MAMMOGRAM WITH TOMOSYNTHESIS AND CAD
TECHNIQUE: Bilateral screening digital craniocaudal and mediolateral oblique
mammograms were obtained. Bilateral screening digital breast
tomosynthesis was performed. The images were evaluated with
computer-aided detection.

[R MLO synth-2D]
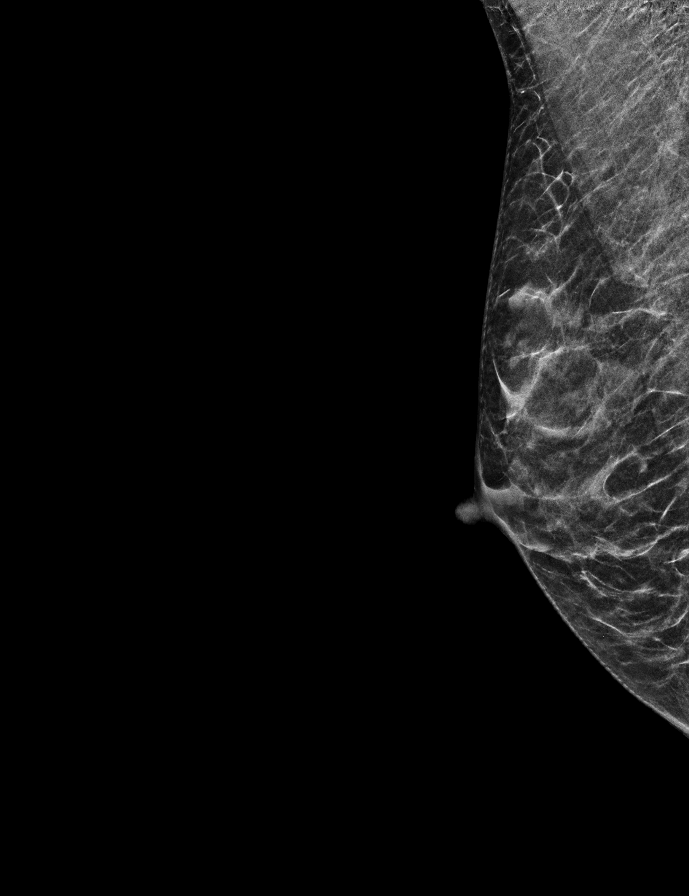

[R CC synth-2D]
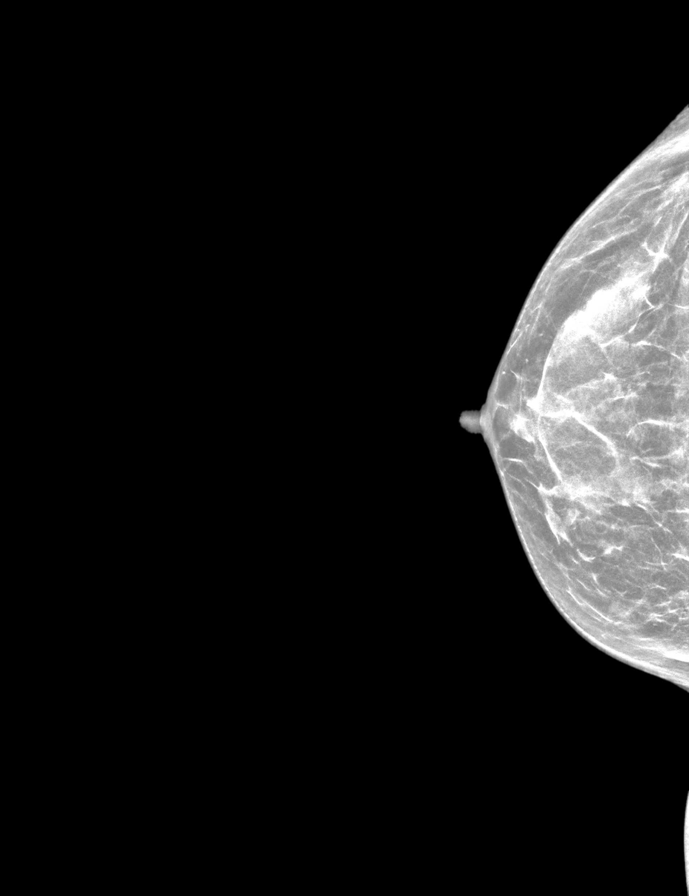

[L CC synth-2D]
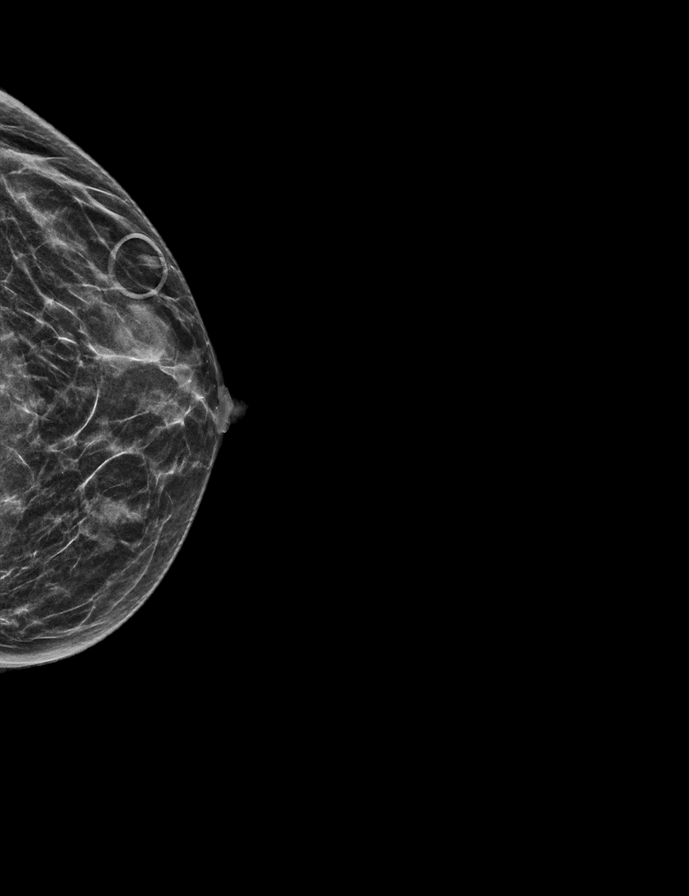

[L MLO synth-2D]
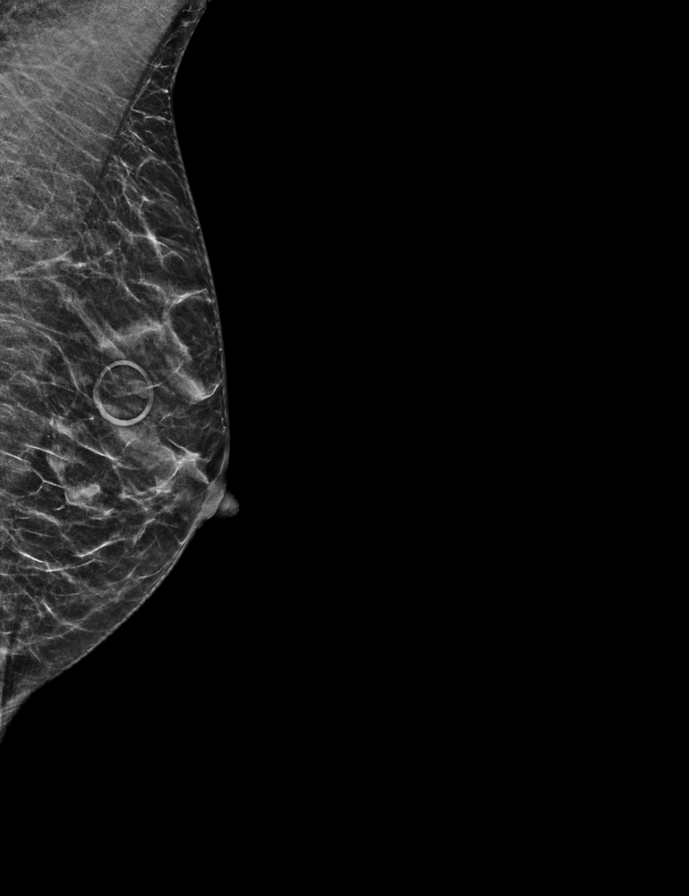

[L CC tomo · 2 of 37 frames shown]
[frame 13/37]
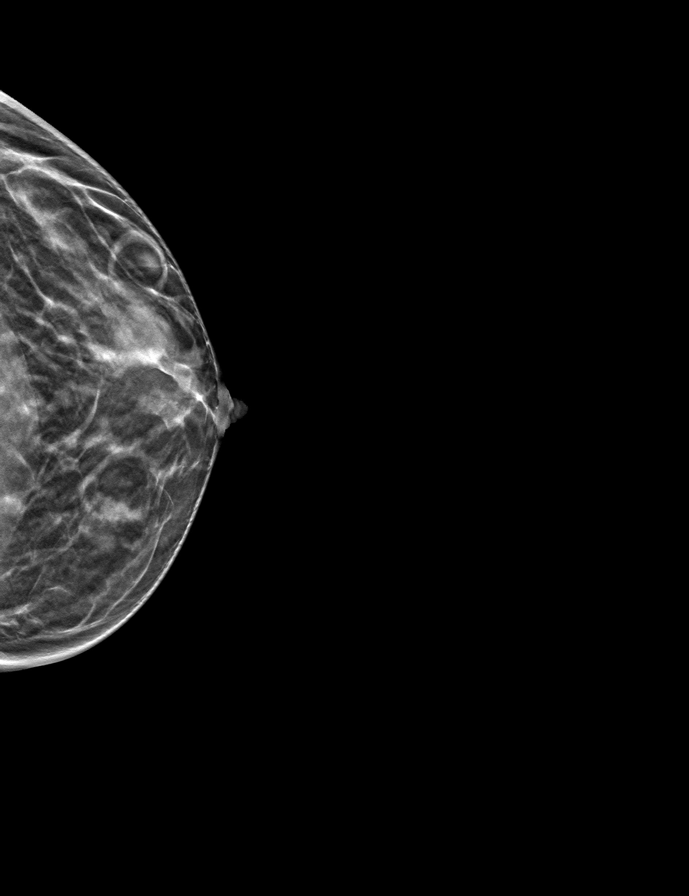
[frame 19/37]
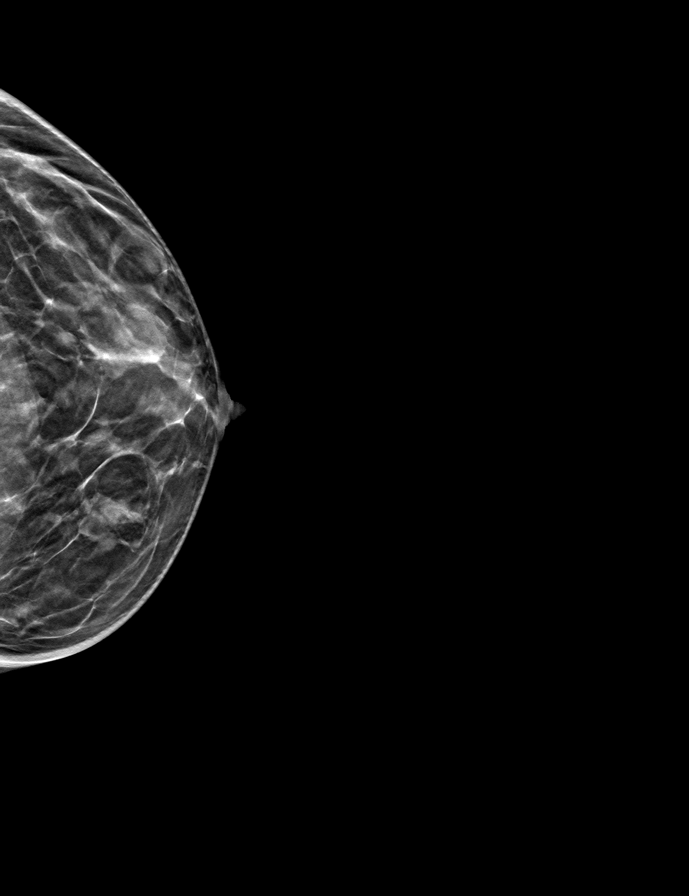

[L MLO tomo · tomo slice 18/35.0]
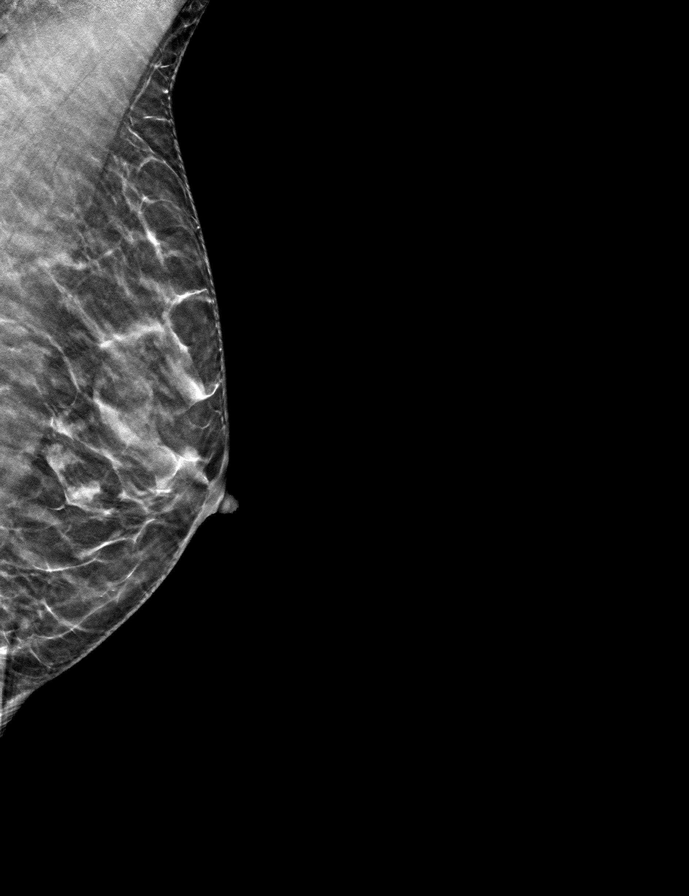

[R MLO tomo · tomo slice 17/34.0]
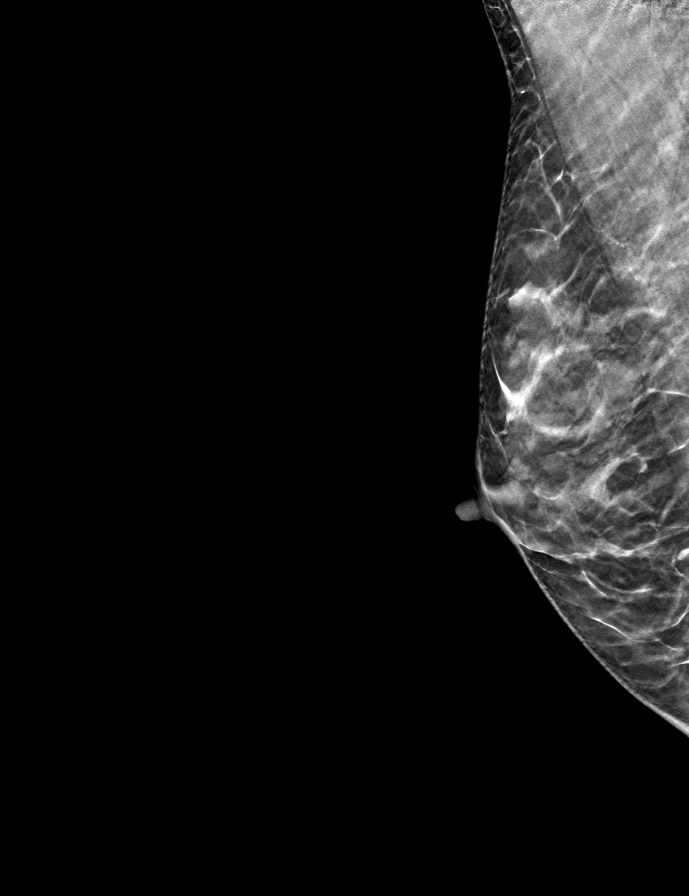

[R CC tomo · tomo slice 21/41.0]
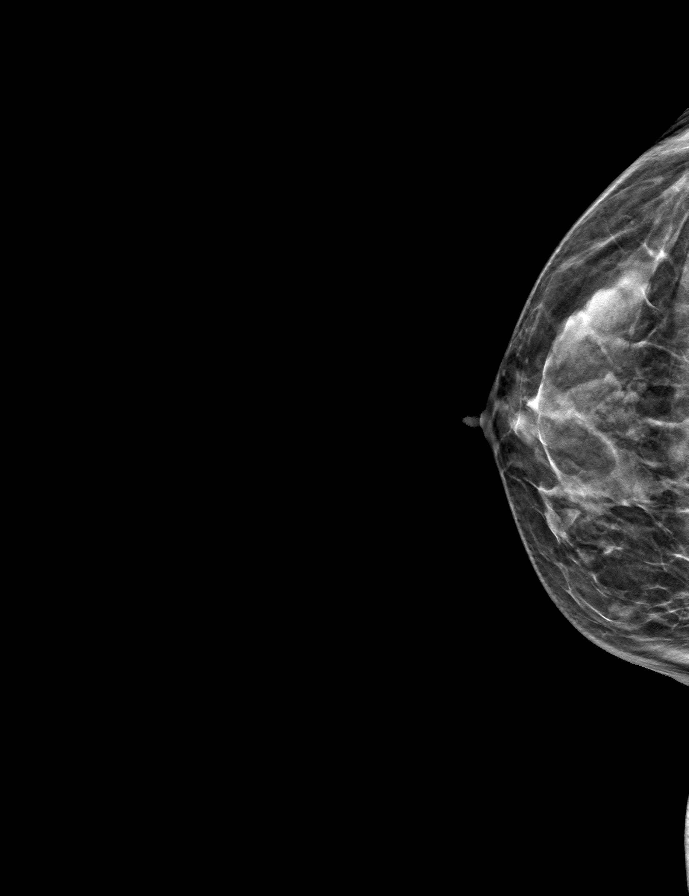

[9 of 24 positions shown; findings below may reference images not displayed]

ACR Breast Density Category c: The breast tissue is heterogeneously
dense, which may obscure small masses.
FINDINGS: There are no findings suspicious for malignancy. The images were
evaluated with computer-aided detection.
IMPRESSION: No mammographic evidence of malignancy. A result letter of this
screening mammogram will be mailed directly to the patient.

RECOMMENDATION:
Screening mammogram in one year. (Code:T4-5-GWO)

BI-RADS CATEGORY  1: Negative.

## 2023-03-13 ENCOUNTER — Other Ambulatory Visit: Payer: Self-pay | Admitting: Obstetrics & Gynecology

## 2023-03-13 ENCOUNTER — Telehealth (HOSPITAL_BASED_OUTPATIENT_CLINIC_OR_DEPARTMENT_OTHER): Payer: Self-pay | Admitting: Family Medicine

## 2023-03-13 DIAGNOSIS — R928 Other abnormal and inconclusive findings on diagnostic imaging of breast: Secondary | ICD-10-CM

## 2023-03-18 ENCOUNTER — Ambulatory Visit: Payer: Managed Care, Other (non HMO) | Admitting: Allergy

## 2023-03-20 ENCOUNTER — Other Ambulatory Visit: Payer: Self-pay

## 2023-03-20 ENCOUNTER — Encounter: Payer: Self-pay | Admitting: Allergy

## 2023-03-20 ENCOUNTER — Ambulatory Visit (INDEPENDENT_AMBULATORY_CARE_PROVIDER_SITE_OTHER): Payer: Managed Care, Other (non HMO) | Admitting: Allergy

## 2023-03-20 VITALS — BP 110/68 | HR 90 | Temp 98.7°F | Resp 12 | Ht 62.6 in | Wt 109.9 lb

## 2023-03-20 DIAGNOSIS — J328 Other chronic sinusitis: Secondary | ICD-10-CM

## 2023-03-20 DIAGNOSIS — J301 Allergic rhinitis due to pollen: Secondary | ICD-10-CM | POA: Diagnosis not present

## 2023-03-20 DIAGNOSIS — R0602 Shortness of breath: Secondary | ICD-10-CM

## 2023-03-20 DIAGNOSIS — J3089 Other allergic rhinitis: Secondary | ICD-10-CM | POA: Diagnosis not present

## 2023-03-20 DIAGNOSIS — H1013 Acute atopic conjunctivitis, bilateral: Secondary | ICD-10-CM

## 2023-03-20 DIAGNOSIS — H04123 Dry eye syndrome of bilateral lacrimal glands: Secondary | ICD-10-CM

## 2023-03-20 MED ORDER — XHANCE 93 MCG/ACT NA EXHU: INHALANT_SUSPENSION | NASAL | 5 refills | Status: AC

## 2023-03-20 NOTE — Progress Notes (Signed)
Follow Up Note  RE: Jody Clarke MRN: [id number] DOB: 02/09/1961 Date of Office Visit: 03/20/2023  Referring provider: Willow Ora, MD Primary care provider: Willow Ora, MD  Chief Complaint: Allergic Rhinitis  (Wants to know if a medication other than Singulair could help. States that her allergies are about the same.)  History of Present Illness: I had the pleasure of seeing Jody Clarke for a follow up visit at the [facility] on 03/20/2023. She is a 62 y.o. female, who is being followed for allergic rhinoconjunctivitis and shortness of breath. Her previous allergy office visit was on 01/14/2023 with Dr. Selena Batten. Today is a regular follow up visit.  Discussed the use of AI scribe software for clinical note transcription with the patient, who gave verbal consent to proceed.  The patient presents with persistent symptoms despite the use of prescribed Singulair and Flonase. She reports no significant improvement with these medications. Her symptoms seem to worsen at night, possibly due to post-nasal drainage. She also notes that exposure to dust, such as when handling Christmas decorations, exacerbates her symptoms, particularly affecting her eyes.  The patient also suffers from dry eyes, for which she is under treatment. She is scheduled to undergo Intense Pulsed Light (IPL) therapy in January. She has tried various treatments for this condition, but none have provided a cure.  In the past, the patient has seen an ENT specialist for ear cleaning and audiology but not for nasal issues. She has a history of a deviated septum.  The patient expresses a desire to avoid lifelong medication use if possible. She is open to further allergy testing, including blood work, to identify potential allergens beyond tree pollen. She is also open to trying a different nasal spray if it might provide better relief.     Assessment and Plan: [name] is a 62 y.o. female with: Seasonal  allergic rhinitis due to pollen Allergic conjunctivitis of both eyes Past history - Perennial symptoms. Current treatment with Zyrtec and Flonase provides some relief, but symptoms persist. Worse off antihistamines. Better when not around dogs. 2024 skin testing positive to tree pollen. Interim history - Symptoms persist despite use of Singulair and Flonase. No significant improvement noted with Singulair. Continue environmental control measures. Tree pollen allergy does not explain your perennial symptoms.  Get bloodwork.  If positive will recommend allergy shots. If negative will recommend seeing ENT again. Use over the counter antihistamines such as Zyrtec (cetirizine), Claritin (loratadine), Allegra (fexofenadine), or Xyzal (levocetirizine) daily as needed. May switch antihistamines every few months. Start Xhance 1-2 sprays per nostril twice a day. Sample given.  If not covered, then go back to using  Use Flonase (fluticasone) nasal spray 1-2 sprays per nostril once a day as needed for nasal congestion.  Nasal saline spray (i.e., Simply Saline) or nasal saline lavage (i.e., NeilMed) is recommended as needed and prior to medicated nasal sprays. Use azelastine nasal spray 1-2 sprays per nostril twice a day as needed for runny nose/drainage  Use sparingly as it can worsen dry eyes.   Dry eyes, bilateral Past history - chronic dry eyes, previously treated with tear plugs and medications.  Interim history - unchanged. Follow up with your eye doctor - ask about other dry eye treatments.  Limit antihistamine pill and nasal spray use. May use refresh eye drops as needed.   Shortness of breath Past history - Episodes of SOB outdoors at times. No prior asthma diagnosis or inhaler use. 2024  spirometry normal. Monitor symptoms. Declined albuterol Rx.   Return in about 3 months (around 06/18/2023).  Meds ordered this encounter  Medications   Fluticasone Propionate (XHANCE) 93 MCG/ACT EXHU     Sig: 1-2 sprays per nostril twice a day.    Dispense:  32 mL    Refill:  5    [phone]   Lab Orders         Allergens w/Total IgE Area 2     Diagnostics: None.   Medication List:  Current Outpatient Medications  Medication Sig Dispense Refill   ALPRAZolam (XANAX) 0.5 MG tablet Take 1 tablet (0.5 mg total) by mouth at bedtime as needed for anxiety (vertigo). 30 tablet 0   azelastine (ASTELIN) 0.1 % nasal spray daily as needed.  5   b complex vitamins capsule Take 1 capsule by mouth daily.     cetirizine (ZYRTEC) 10 MG tablet Take 10 mg by mouth daily.     Cholecalciferol (VITAMIN D3) 25 MCG (1000 UT) CAPS Take by mouth.     estradiol (ESTRACE) 0.5 MG tablet TAKE 1 TABLET BY MOUTH EVERY DAY 30 tablet 0   Fluticasone Propionate (XHANCE) 93 MCG/ACT EXHU 1-2 sprays per nostril twice a day. 32 mL 5   progesterone (PROMETRIUM) 100 MG capsule TAKE 1 CAPSULE BY MOUTH EVERY DAY 30 capsule 0   tretinoin (RETIN-A) 0.05 % cream Apply 1 application  topically daily.     No current facility-administered medications for this visit.   Allergies: Allergies  Allergen Reactions   Sulfa Antibiotics Other (See Comments)   Ciprofloxacin     Agitation/ tachy   I reviewed her past medical history, social history, family history, and environmental history and no significant changes have been reported from her previous visit.  Review of Systems  Constitutional:  Negative for appetite change, chills, fever and unexpected weight change.  HENT:  Positive for congestion and rhinorrhea.   Eyes:  Positive for itching.       Dry  Respiratory:  Negative for cough, chest tightness and wheezing.   Cardiovascular:  Negative for chest pain.  Gastrointestinal:  Negative for abdominal pain.  Genitourinary:  Negative for difficulty urinating.  Skin:  Negative for rash.  Allergic/Immunologic: Positive for environmental allergies.    Objective: BP 110/68 (BP Location: Right Arm, Patient Position: Sitting,  Cuff Size: Normal)   Pulse 90   Temp 98.7 F (37.1 C) (Temporal)   Resp 12   Ht 5' 2.6" (1.59 m)   Wt 109 lb 14.4 oz (49.9 kg)   LMP 04/16/2008   SpO2 99%   BMI 19.72 kg/m  Body mass index is 19.72 kg/m. Physical Exam Vitals and nursing note reviewed.  Constitutional:      Appearance: Normal appearance. She is well-developed.  HENT:     Head: Normocephalic and atraumatic.     Right Ear: Tympanic membrane and external ear normal.     Left Ear: Tympanic membrane and external ear normal.     Nose: Nose normal.     Mouth/Throat:     Mouth: Mucous membranes are moist.     Pharynx: Oropharynx is clear.  Eyes:     Conjunctiva/sclera: Conjunctivae normal.  Cardiovascular:     Rate and Rhythm: Normal rate and regular rhythm.     Heart sounds: Normal heart sounds. No murmur heard.    No friction rub. No gallop.  Pulmonary:     Effort: Pulmonary effort is normal.     Breath sounds: Normal breath  sounds. No wheezing, rhonchi or rales.  Musculoskeletal:     Cervical back: Neck supple.  Skin:    General: Skin is warm.     Findings: No rash.  Neurological:     Mental Status: She is alert and oriented to person, place, and time.  Psychiatric:        Behavior: Behavior normal.    Previous notes and tests were reviewed. The plan was reviewed with the patient/family, and all questions/concerned were addressed.  It was my pleasure to see Ninnie today and participate in her care. Please feel free to contact me with any questions or concerns.  Sincerely,  Wyline Mood, DO Allergy & Immunology  [facility] office: [phone] [facility] office: [phone]

## 2023-03-20 NOTE — Patient Instructions (Addendum)
Environmental allergies Continue environmental control measures.  Tree pollen allergy does not explain your perennial symptoms.  Get bloodwork.  If positive will recommend allergy shots. If negative will recommend seeing ENT again. Use over the counter antihistamines such as Zyrtec (cetirizine), Claritin (loratadine), Allegra (fexofenadine), or Xyzal (levocetirizine) daily as needed. May switch antihistamines every few months. Start Xhance 1-2 sprays per nostril twice a day. Sample given.  This will be mailed to you.  [facility] will [facility] you regarding coverage. If not covered, then go back to using  Use Flonase (fluticasone) nasal spray 1-2 sprays per nostril once a day as needed for nasal congestion.  Nasal saline spray (i.e., Simply Saline) or nasal saline lavage (i.e., NeilMed) is recommended as needed and prior to medicated nasal sprays. Use azelastine nasal spray 1-2 sprays per nostril twice a day as needed for runny nose/drainage  Use sparingly as it can worsen dry eyes.   Dry eyes Follow up with your eye doctor - ask about other dry eye treatments.  Limit antihistamine pill and nasal spray use. May use refresh eye drops as needed.   Shortness of breath Monitor symptoms.  Return in about 3 months (around 06/18/2023). Or sooner if needed.   Reducing Pollen Exposure Pollen seasons: trees (spring), grass (summer) and ragweed/weeds (fall). Keep windows closed in your home and car to lower pollen exposure.  Install air conditioning in the bedroom and throughout the house if possible.  Avoid going out in dry windy days - especially early morning. Pollen counts are highest between 5 - 10 AM and on dry, hot and windy days.  Save outside activities for late afternoon or after a heavy rain, when pollen levels are lower.  Avoid mowing of grass if you have grass pollen allergy. Be aware that pollen can also be transported indoors on people and pets.  Dry your clothes in  an automatic dryer rather than hanging them outside where they might collect pollen.  Rinse hair and eyes before bedtime.

## 2023-03-22 LAB — ALLERGENS W/TOTAL IGE AREA 2

## 2023-03-26 NOTE — Progress Notes (Deleted)
62 y.o. G91P1011 Married White or Caucasian female here for annual exam.    Patient's last menstrual period was 04/16/2008.          Sexually active: {yes no:314532}  The current method of family planning is {contraception:315051}.     The pregnancy intention screening data noted above was reviewed. Potential methods of contraception were discussed. The patient elected to proceed with No data recorded.  Exercising: {yes no:314532}  {types:19826} Smoker:  {YES [id number]  Health Maintenance: Pap:  07/28/2019 Negative History of abnormal Pap:  {YES NO:22349} MMG:  03/11/2023 Additional imaging needed Colonoscopy:  *** BMD:   09/06/2020 Osteoporosis Screening Labs: ***   reports that she has never smoked. She has never been exposed to tobacco smoke. She has never used smokeless tobacco. She reports current alcohol use. She reports that she does not use drugs.  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 30 tablet 0   azelastine (ASTELIN) 0.1 % nasal spray daily as needed.  5   b complex vitamins capsule Take 1 capsule by mouth daily.     cetirizine (ZYRTEC) 10 MG tablet Take 10 mg by mouth daily.     Cholecalciferol (VITAMIN D3) 25 MCG (1000 UT) CAPS Take by mouth.     estradiol (ESTRACE) 0.5 MG tablet TAKE 1 TABLET BY MOUTH EVERY DAY 30 tablet 0   Fluticasone Propionate (XHANCE) 93 MCG/ACT EXHU  1-2 sprays per nostril twice a day. 32 mL 5   progesterone (PROMETRIUM) 100 MG capsule TAKE 1 CAPSULE BY MOUTH EVERY DAY 30 capsule 0   tretinoin (RETIN-A) 0.05 % cream Apply 1 application  topically daily.     No current facility-administered medications for this visit.    Family History  Problem Relation Age of Onset   Osteoporosis Mother    Cancer Father    Atrial fibrillation Father    Breast cancer Neg Hx     ROS: Constitutional: {Findings; ROS constitutional:30497::"negative"} Genitourinary:{Findings; ROS genitourinary:19593::"negative"}  Exam:   LMP 04/16/2008      General appearance: alert, cooperative and appears stated age Head: Normocephalic, without obvious abnormality, atraumatic Neck: no adenopathy, supple, symmetrical, trachea midline and thyroid {EXAM; THYROID:18604} Lungs: clear to auscultation bilaterally Breasts: {Exam; breast:13139::"normal appearance, no masses or tenderness"} Heart: regular rate and rhythm Abdomen: soft, non-tender; bowel sounds normal; no masses,  no organomegaly Extremities: extremities normal, atraumatic, no cyanosis or edema Skin: Skin color, texture, turgor normal. No rashes or lesions Lymph nodes: Cervical, supraclavicular, and axillary nodes normal. No abnormal inguinal nodes palpated Neurologic: Grossly normal   Pelvic: External genitalia:  no lesions              Urethra:  normal appearing urethra with no masses, tenderness or lesions  Bartholins and Skenes: normal                 Vagina: normal appearing vagina with normal color and no discharge, no lesions              Cervix: {exam; cervix:14595}              Pap taken: {yes no:314532} Bimanual Exam:  Uterus:  {exam; uterus:12215}              Adnexa: {exam; adnexa:12223}               Rectovaginal: Confirms               Anus:  normal sphincter tone, no lesions  Chaperone, ***, CMA, was present for exam.  Assessment/Plan:

## 2023-03-28 ENCOUNTER — Ambulatory Visit (HOSPITAL_BASED_OUTPATIENT_CLINIC_OR_DEPARTMENT_OTHER): Payer: Managed Care, Other (non HMO) | Admitting: Obstetrics & Gynecology

## 2023-03-28 ENCOUNTER — Encounter (HOSPITAL_BASED_OUTPATIENT_CLINIC_OR_DEPARTMENT_OTHER): Payer: Self-pay | Admitting: Obstetrics & Gynecology

## 2023-03-28 ENCOUNTER — Other Ambulatory Visit (HOSPITAL_COMMUNITY)
Admission: RE | Admit: 2023-03-28 | Discharge: 2023-03-28 | Disposition: A | Discharge status: Home / Self Care | Payer: Managed Care, Other (non HMO) | Source: Ambulatory Visit | Attending: Obstetrics & Gynecology | Admitting: Obstetrics & Gynecology

## 2023-03-28 VITALS — BP 114/70 | HR 74 | Ht 63.0 in | Wt 108.8 lb

## 2023-03-28 DIAGNOSIS — Z01419 Encounter for gynecological examination (general) (routine) without abnormal findings: Secondary | ICD-10-CM

## 2023-03-28 DIAGNOSIS — Z0000 Encounter for general adult medical examination without abnormal findings: Secondary | ICD-10-CM

## 2023-03-28 DIAGNOSIS — Z124 Encounter for screening for malignant neoplasm of cervix: Secondary | ICD-10-CM

## 2023-03-28 DIAGNOSIS — M810 Age-related osteoporosis without current pathological fracture: Secondary | ICD-10-CM | POA: Diagnosis not present

## 2023-03-28 DIAGNOSIS — N952 Postmenopausal atrophic vaginitis: Secondary | ICD-10-CM | POA: Diagnosis not present

## 2023-03-28 DIAGNOSIS — Z79890 Hormone replacement therapy (postmenopausal): Secondary | ICD-10-CM

## 2023-03-28 MED ORDER — ESTRADIOL 1 MG PO TABS: 1.0000 mg | ORAL_TABLET | Freq: Every day | ORAL | 0 refills | Status: DC

## 2023-03-28 MED ORDER — PROGESTERONE 200 MG PO CAPS: 200.0000 mg | ORAL_CAPSULE | Freq: Every day | ORAL | 0 refills | Status: DC

## 2023-03-28 MED ORDER — ESTRADIOL 0.1 MG/GM VA CREA: TOPICAL_CREAM | VAGINAL | 1 refills | Status: DC

## 2023-03-28 NOTE — Progress Notes (Signed)
62 y.o. G70P1011 Married White or Caucasian female here for annual exam.  Would like to consider increasing dose of estrogen for improved sleep, anxiety.  Saw urogyn for incontinence after deciding not to pursue pelvic floor PT. Waking to use bathroom 4x nightly. Withholding fluids closer to evening. Has never tried medication for incontinence. Reports burning, itching, urinary frequency.   Patient's last menstrual period was 04/16/2008.          Sexually active: Yes.    The current method of family planning is post menopausal status.     Exercising: Yes.    Gym/ health club routine includes cardio and light weights. Smoker:  no  Health Maintenance: Pap:  07/28/2019 NILM HPV Negative History of abnormal Pap:  no MMG:  03/11/2023 Additional imaging needed.  Has follow up scheduled 12/19. Colonoscopy:  01/02/22 Cologuard negative  BMD:   09/06/2020 Osteoporosis   reports that she has never smoked. She has never been exposed to tobacco smoke. She has never used smokeless tobacco. She reports current alcohol use. She reports that she does not use drugs.  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 30 tablet 0   azelastine (ASTELIN) 0.1 % nasal spray daily as needed.  5   b complex vitamins capsule Take 1 capsule by mouth daily.     cetirizine (ZYRTEC) 10 MG tablet Take 10 mg by mouth daily.      Cholecalciferol (VITAMIN D3) 25 MCG (1000 UT) CAPS Take by mouth.     estradiol (ESTRACE) 0.1 MG/GM vaginal cream 1 gram vaginally twice weekly 42.5 g 1   Fluticasone Propionate (XHANCE) 93 MCG/ACT EXHU 1-2 sprays per nostril twice a day. 32 mL 5   tretinoin (RETIN-A) 0.05 % cream Apply 1 application  topically daily.     estradiol (ESTRACE) 1 MG tablet Take 1 tablet (1 mg total) by mouth daily. 90 tablet 0   progesterone (PROMETRIUM) 200 MG capsule Take 1 capsule (200 mg total) by mouth daily. 90 capsule 0   No current facility-administered medications for this visit.    Family History  Problem Relation Age of Onset   Osteoporosis Mother    Cancer Father    Atrial fibrillation Father    Breast cancer Neg Hx     ROS: Pertinent positive included in HPI. All other systems negative.   Exam:   BP 114/70 (BP Location: Left Arm, Patient Position: Sitting, Cuff Size: Normal)   Pulse 74   Ht 5\' 3"  (1.6 m)   Wt 108 lb 12.8 oz (49.4 kg)   LMP 04/16/2008   BMI 19.27 kg/m   Height: 5\' 3"  (160 cm)  General appearance: alert, cooperative and appears stated age Head: Normocephalic, without obvious abnormality, atraumatic Neck: no adenopathy, supple, symmetrical, trachea midline and thyroid normal to inspection  and palpation Lungs: clear to auscultation bilaterally Breasts: normal appearance, no masses or tenderness Heart: regular rate and rhythm Abdomen: soft, non-tender; bowel sounds normal; no masses,  no organomegaly Extremities: extremities normal, atraumatic, no cyanosis or edema Skin: Skin color, texture, turgor normal. No rashes or lesions Lymph nodes: Cervical, supraclavicular, and axillary nodes normal. No abnormal inguinal nodes palpated Neurologic: Grossly normal  Pelvic: External genitalia:  no lesions              Urethra:  normal appearing urethra with no masses, tenderness or lesions              Bartholins and Skenes: normal                 Vagina: +Atrophy. normal  appearing vagina with normal color and no discharge, no lesions              Cervix:  normal.              Pap taken: Yes.   Bimanual Exam:  Uterus:  normal and normal size, contour, position, consistency, mobility, non-tender              Adnexa: normal adnexa               Rectovaginal: Confirms               Anus:  normal sphincter tone, no lesions  Chaperone was present for exam.  Assessment/Plan:  1. Encounter for annual wellness visit (Primary) - Last pap smear 07/28/2019 NILM HPV Negative, repeat today - MMG:  03/11/2023, additional imaging needed and ordered - Colonoscopy: 01/02/22 Cologuard negative, repeat 2026 - BMD:  09/06/2020 Osteoporosis, repeat 2025 - Vaccinations reviewed; patient desires delay on Shingrix, Tdap, RSV. Declines flu; risks/benefits reviewed.   2. Postmenopausal HRT (hormone replacement therapy) - Increase estradiol 0.5 mg to 1 mg daily.  She is to give update in 4-6 weeks.  3 month supply given.  Will need to update rx after that time.   - Increase prometrium therapy to 200 mg daily as well. - Discussed risks/benefits of additional estrogen therapy.   3. Age-related osteoporosis without current pathological fracture - Scheduled for DEXA January   4. Vaginal atrophy - estradiol (ESTRACE) 0.1 MG/GM vaginal cream; 1 gram vaginally twice weekly  Dispense: 42.5 g; Refill: 1  5. Cervical cancer screening - Cytology - PAP( [facility])  History and physical exam performed with Clement Sayres, PA.

## 2023-03-31 ENCOUNTER — Encounter (HOSPITAL_BASED_OUTPATIENT_CLINIC_OR_DEPARTMENT_OTHER): Payer: Self-pay | Admitting: Obstetrics & Gynecology

## 2023-04-03 LAB — CYTOLOGY - PAP
Comment: NEGATIVE
Diagnosis: NEGATIVE
High risk HPV: NEGATIVE

## 2023-04-04 ENCOUNTER — Ambulatory Visit
Admission: RE | Admit: 2023-04-04 | Discharge: 2023-04-04 | Disposition: A | Discharge status: Home / Self Care | Payer: Managed Care, Other (non HMO) | Source: Ambulatory Visit | Attending: Obstetrics & Gynecology | Admitting: Obstetrics & Gynecology

## 2023-04-04 ENCOUNTER — Other Ambulatory Visit: Payer: Self-pay | Admitting: Obstetrics & Gynecology

## 2023-04-04 DIAGNOSIS — N6320 Unspecified lump in the left breast, unspecified quadrant: Secondary | ICD-10-CM

## 2023-04-04 DIAGNOSIS — R928 Other abnormal and inconclusive findings on diagnostic imaging of breast: Secondary | ICD-10-CM

## 2023-04-06 ENCOUNTER — Other Ambulatory Visit (HOSPITAL_BASED_OUTPATIENT_CLINIC_OR_DEPARTMENT_OTHER): Payer: Self-pay | Admitting: Obstetrics & Gynecology

## 2023-04-06 DIAGNOSIS — Z79890 Hormone replacement therapy (postmenopausal): Secondary | ICD-10-CM

## 2023-04-08 ENCOUNTER — Other Ambulatory Visit (HOSPITAL_BASED_OUTPATIENT_CLINIC_OR_DEPARTMENT_OTHER): Payer: Self-pay | Admitting: Obstetrics & Gynecology

## 2023-04-08 ENCOUNTER — Telehealth: Payer: Self-pay

## 2023-04-08 ENCOUNTER — Ambulatory Visit (HOSPITAL_BASED_OUTPATIENT_CLINIC_OR_DEPARTMENT_OTHER): Payer: Managed Care, Other (non HMO)

## 2023-04-08 VITALS — BP 123/84 | HR 74 | Ht 63.0 in | Wt 110.2 lb

## 2023-04-08 DIAGNOSIS — R300 Dysuria: Secondary | ICD-10-CM | POA: Diagnosis not present

## 2023-04-08 LAB — POCT URINALYSIS DIPSTICK
Bilirubin, UA: NEGATIVE
Blood, UA: NEGATIVE
Glucose, UA: NEGATIVE
Ketones, UA: NEGATIVE
Leukocytes, UA: NEGATIVE
Nitrite, UA: NEGATIVE
Protein, UA: NEGATIVE
Spec Grav, UA: 1.02 (ref 1.010–1.025)
Urobilinogen, UA: 0.2 U/dL
pH, UA: 5.5 (ref 5.0–8.0)

## 2023-04-08 MED ORDER — NITROFURANTOIN MONOHYD MACRO 100 MG PO CAPS: 100.0000 mg | ORAL_CAPSULE | Freq: Two times a day (BID) | ORAL | 0 refills | Status: DC

## 2023-04-08 NOTE — Telephone Encounter (Signed)
Copied from CRM [id number]. Topic: Clinical - Medical Advice >> Apr 08, 2023  1:14 PM Lars Mage H wrote: Reason for CRM: Patient is experiencing frequent urination and burning sensation - she would like to see if she could come in for a urine check and see one of the providers today or tomorrow.   Contact center agent was able to pull schedules for the 26th - Should the patient go to urgent care or is there way she can be seen by the practice?

## 2023-04-08 NOTE — Progress Notes (Signed)
Pt presented to the office with symptoms of a possible uti. Pt stated she had dysuria. Pt was educated on how to obtain an urine sample. Urine sample was obtained and poc dipstick was performed. Dipstick was resulted and Dr. Hyacinth Meeker is aware of Holidays with office being closed so she went ahead and treated pt. Urine Culture sent out as well.

## 2023-04-09 LAB — URINE CULTURE: Organism ID, Bacteria: NO GROWTH

## 2023-04-11 ENCOUNTER — Other Ambulatory Visit: Payer: Self-pay | Admitting: Allergy

## 2023-04-18 ENCOUNTER — Other Ambulatory Visit: Payer: Managed Care, Other (non HMO)

## 2023-04-19 ENCOUNTER — Ambulatory Visit
Admission: RE | Admit: 2023-04-19 | Discharge: 2023-04-19 | Disposition: A | Discharge status: Home / Self Care | Payer: Managed Care, Other (non HMO) | Source: Ambulatory Visit | Attending: Obstetrics & Gynecology | Admitting: Obstetrics & Gynecology

## 2023-04-19 DIAGNOSIS — N6320 Unspecified lump in the left breast, unspecified quadrant: Secondary | ICD-10-CM

## 2023-04-19 DIAGNOSIS — R928 Other abnormal and inconclusive findings on diagnostic imaging of breast: Secondary | ICD-10-CM

## 2023-04-19 HISTORY — PX: BREAST BIOPSY: SHX20

## 2023-04-22 LAB — SURGICAL PATHOLOGY

## 2023-04-23 ENCOUNTER — Encounter (HOSPITAL_BASED_OUTPATIENT_CLINIC_OR_DEPARTMENT_OTHER): Payer: Self-pay | Admitting: Obstetrics & Gynecology

## 2023-04-23 ENCOUNTER — Other Ambulatory Visit (HOSPITAL_BASED_OUTPATIENT_CLINIC_OR_DEPARTMENT_OTHER): Payer: Self-pay | Admitting: Obstetrics & Gynecology

## 2023-04-23 DIAGNOSIS — Z79890 Hormone replacement therapy (postmenopausal): Secondary | ICD-10-CM

## 2023-04-23 MED ORDER — NORETHINDRONE ACETATE 5 MG PO TABS: 2.5000 mg | ORAL_TABLET | Freq: Every day | ORAL | 1 refills | Status: DC

## 2023-04-24 ENCOUNTER — Other Ambulatory Visit (HOSPITAL_BASED_OUTPATIENT_CLINIC_OR_DEPARTMENT_OTHER): Payer: Self-pay | Admitting: Obstetrics & Gynecology

## 2023-04-24 DIAGNOSIS — Z79890 Hormone replacement therapy (postmenopausal): Secondary | ICD-10-CM

## 2023-04-25 ENCOUNTER — Ambulatory Visit (HOSPITAL_BASED_OUTPATIENT_CLINIC_OR_DEPARTMENT_OTHER)
Admission: RE | Admit: 2023-04-25 | Discharge: 2023-04-25 | Disposition: A | Discharge status: Home / Self Care | Payer: Managed Care, Other (non HMO) | Source: Ambulatory Visit | Attending: Family Medicine | Admitting: Family Medicine

## 2023-04-25 ENCOUNTER — Encounter (HOSPITAL_BASED_OUTPATIENT_CLINIC_OR_DEPARTMENT_OTHER): Payer: Self-pay | Admitting: Obstetrics & Gynecology

## 2023-04-25 ENCOUNTER — Other Ambulatory Visit (HOSPITAL_BASED_OUTPATIENT_CLINIC_OR_DEPARTMENT_OTHER): Payer: Self-pay | Admitting: Obstetrics & Gynecology

## 2023-04-25 DIAGNOSIS — Z79890 Hormone replacement therapy (postmenopausal): Secondary | ICD-10-CM

## 2023-04-25 DIAGNOSIS — M810 Age-related osteoporosis without current pathological fracture: Secondary | ICD-10-CM

## 2023-04-25 DIAGNOSIS — Z780 Asymptomatic menopausal state: Secondary | ICD-10-CM

## 2023-04-26 ENCOUNTER — Other Ambulatory Visit (HOSPITAL_BASED_OUTPATIENT_CLINIC_OR_DEPARTMENT_OTHER): Payer: Self-pay | Admitting: Obstetrics & Gynecology

## 2023-04-26 DIAGNOSIS — Z79890 Hormone replacement therapy (postmenopausal): Secondary | ICD-10-CM

## 2023-04-27 MED ORDER — ESTRADIOL 1 MG PO TABS: 1.0000 mg | ORAL_TABLET | Freq: Every day | ORAL | 0 refills | Status: DC

## 2023-05-13 ENCOUNTER — Other Ambulatory Visit (HOSPITAL_BASED_OUTPATIENT_CLINIC_OR_DEPARTMENT_OTHER): Payer: Self-pay | Admitting: Obstetrics & Gynecology

## 2023-05-13 DIAGNOSIS — F419 Anxiety disorder, unspecified: Secondary | ICD-10-CM

## 2023-05-13 MED ORDER — ALPRAZOLAM 0.5 MG PO TABS: 0.5000 mg | ORAL_TABLET | Freq: Every evening | ORAL | 0 refills | Status: DC | PRN

## 2023-05-18 ENCOUNTER — Other Ambulatory Visit (HOSPITAL_BASED_OUTPATIENT_CLINIC_OR_DEPARTMENT_OTHER): Payer: Self-pay | Admitting: Obstetrics & Gynecology

## 2023-05-18 DIAGNOSIS — Z79890 Hormone replacement therapy (postmenopausal): Secondary | ICD-10-CM

## 2023-05-20 ENCOUNTER — Other Ambulatory Visit (HOSPITAL_BASED_OUTPATIENT_CLINIC_OR_DEPARTMENT_OTHER): Payer: Self-pay | Admitting: Obstetrics & Gynecology

## 2023-05-20 ENCOUNTER — Other Ambulatory Visit (HOSPITAL_BASED_OUTPATIENT_CLINIC_OR_DEPARTMENT_OTHER): Payer: Self-pay | Admitting: *Deleted

## 2023-05-20 DIAGNOSIS — Z79890 Hormone replacement therapy (postmenopausal): Secondary | ICD-10-CM

## 2023-05-20 MED ORDER — ESTRADIOL 1 MG PO TABS: 1.0000 mg | ORAL_TABLET | Freq: Every day | ORAL | 0 refills | Status: DC

## 2023-05-29 ENCOUNTER — Encounter (HOSPITAL_BASED_OUTPATIENT_CLINIC_OR_DEPARTMENT_OTHER): Payer: Self-pay | Admitting: Obstetrics & Gynecology

## 2023-05-29 ENCOUNTER — Ambulatory Visit (HOSPITAL_BASED_OUTPATIENT_CLINIC_OR_DEPARTMENT_OTHER): Payer: Managed Care, Other (non HMO) | Admitting: Obstetrics & Gynecology

## 2023-05-29 VITALS — BP 114/84 | HR 88 | Ht 62.5 in | Wt 110.2 lb

## 2023-05-29 DIAGNOSIS — G479 Sleep disorder, unspecified: Secondary | ICD-10-CM | POA: Diagnosis not present

## 2023-05-29 DIAGNOSIS — F419 Anxiety disorder, unspecified: Secondary | ICD-10-CM

## 2023-05-29 DIAGNOSIS — R3915 Urgency of urination: Secondary | ICD-10-CM | POA: Diagnosis not present

## 2023-05-29 DIAGNOSIS — N951 Menopausal and female climacteric states: Secondary | ICD-10-CM

## 2023-05-29 NOTE — Progress Notes (Signed)
GYNECOLOGY  VISIT  CC:   medication recheck  HPI: 63 y.o. G18P1011 Married White or Caucasian female here for discussion of HRT.  She increased her estradiol to 1mg  in December.  She was on 0.5mg  daily.  She has also increased her Prometrium to 200mg  at night as well.  Her biggest complaints when I saw her in December were anxiety and sleep issues that are partly related to urinary urgency.  She gets up 4 times nightly.  She has also been working on stopping her anti-histamines that she was taking regularly.  She was having so much issues with sinus congestion that she has restarting.    She is now getting up only 1 or 2 times nightly so this does seem better.  Currently, she desires to stay with the 1mg  of estradiol.  She has not started the vaginal estrogen cream yet but is going to at some point.  Lastly, she was unsure if the progesterone was causing some increased feeling of anxiety.  Norethindrone was prescribed but she decided to continue with the current progesterone at 200mg .  Anxiety is improved so possibly unrelated.  She did get the norethindrone prescription filled but is questioning bio identical.     Past Medical History:  Diagnosis Date   Allergy    Allergic rhinitis   Anxiety    Meniere's disease    Osteopenia    Post-menopause on HRT (hormone replacement therapy) 09/10/2018    MEDS:   Current Outpatient Medications on File Prior to Visit  Medication Sig Dispense Refill   ALPRAZolam (XANAX) 0.5 MG tablet Take 1 tablet (0.5 mg total) by mouth at bedtime as needed for anxiety (vertigo). 30 tablet 0   azelastine (ASTELIN) 0.1 % nasal spray daily as needed.  5   b complex vitamins capsule Take 1 capsule by mouth daily.     cetirizine (ZYRTEC) 10 MG tablet Take 10 mg by mouth daily.     Cholecalciferol (VITAMIN D3) 25 MCG (1000 UT) CAPS Take by mouth.     estradiol (ESTRACE) 0.1 MG/GM vaginal cream 1 gram vaginally twice weekly 42.5 g 1   estradiol (ESTRACE) 1 MG tablet TAKE  1 TABLET BY MOUTH EVERY DAY 90 tablet 0   estradiol (ESTRACE) 1 MG tablet Take 1 tablet (1 mg total) by mouth daily. 30 tablet 0   Fluticasone Propionate (XHANCE) 93 MCG/ACT EXHU 1-2 sprays per nostril twice a day. 32 mL 5   nitrofurantoin, macrocrystal-monohydrate, (MACROBID) 100 MG capsule Take 1 capsule (100 mg total) by mouth 2 (two) times daily. 10 capsule 0   norethindrone (AYGESTIN) 5 MG tablet Take 0.5 tablets (2.5 mg total) by mouth daily. 30 tablet 1   progesterone (PROMETRIUM) 200 MG capsule Take 1 capsule (200 mg total) by mouth daily. 90 capsule 0   tretinoin (RETIN-A) 0.05 % cream Apply 1 application  topically daily.     No current facility-administered medications on file prior to visit.    ALLERGIES: Sulfa antibiotics and Ciprofloxacin  SH:  married, non smoker  Review of Systems  Constitutional: Negative.   Genitourinary:  Positive for urgency.    PHYSICAL EXAMINATION:    BP 114/84 (BP Location: Right Arm, Patient Position: Sitting, Cuff Size: Normal)   Pulse 88   Ht 5' 2.5" (1.588 m)   Wt 110 lb 3.2 oz (50 kg)   LMP 04/16/2008   BMI 19.83 kg/m     Physical Exam Constitutional:      Appearance: Normal appearance.  Neurological:  General: No focal deficit present.     Mental Status: She is alert.  Psychiatric:        Mood and Affect: Mood normal.      Assessment/Plan: 1. Menopausal symptoms (Primary) - after discussion, pt has decided to stay on estradiol 1.0mg  - interested in bioidentical progesterone and possibly having a consult with pharmacist at [facility] - she is going to send me a picture of the progesterone bottle so I can see exactly what she is taking  2. Urinary urgency - improved but she is going to monitor to see if restarting her zyrtec is contributing to worsening symptoms  3. Anxiety  4. Sleep difficulties - sleep hygiene discussed   Total time with pt:  35 minutes Additional documentation: 4 minutes

## 2023-05-30 ENCOUNTER — Encounter (HOSPITAL_BASED_OUTPATIENT_CLINIC_OR_DEPARTMENT_OTHER): Payer: Self-pay | Admitting: Obstetrics & Gynecology

## 2023-05-30 ENCOUNTER — Other Ambulatory Visit (HOSPITAL_BASED_OUTPATIENT_CLINIC_OR_DEPARTMENT_OTHER): Payer: Self-pay | Admitting: Obstetrics & Gynecology

## 2023-05-31 ENCOUNTER — Other Ambulatory Visit (HOSPITAL_BASED_OUTPATIENT_CLINIC_OR_DEPARTMENT_OTHER): Payer: Self-pay | Admitting: Obstetrics & Gynecology

## 2023-06-03 ENCOUNTER — Other Ambulatory Visit (HOSPITAL_BASED_OUTPATIENT_CLINIC_OR_DEPARTMENT_OTHER): Payer: Self-pay | Admitting: Obstetrics & Gynecology

## 2023-06-03 DIAGNOSIS — G479 Sleep disorder, unspecified: Secondary | ICD-10-CM

## 2023-06-03 DIAGNOSIS — F419 Anxiety disorder, unspecified: Secondary | ICD-10-CM

## 2023-06-03 MED ORDER — ALPRAZOLAM 0.25 MG PO TABS: 0.2500 mg | ORAL_TABLET | Freq: Three times a day (TID) | ORAL | 0 refills | Status: DC | PRN

## 2023-06-06 ENCOUNTER — Encounter: Payer: Self-pay | Admitting: Family Medicine

## 2023-06-06 NOTE — Progress Notes (Signed)
See mychart note.   Ms.Glaeser, Thank you for getting your bone density screening test done. I have reviewed the results. Your results show stable low bone mass. No prescription medications are recommended at this time. Continuation of exercise, vit D and calcium supplements are recommend and we will recheck in 2 years.   Sincerely, Dr. Mardelle Matte

## 2023-06-27 ENCOUNTER — Other Ambulatory Visit (HOSPITAL_BASED_OUTPATIENT_CLINIC_OR_DEPARTMENT_OTHER): Payer: Self-pay | Admitting: Obstetrics & Gynecology

## 2023-06-27 DIAGNOSIS — Z79890 Hormone replacement therapy (postmenopausal): Secondary | ICD-10-CM

## 2023-07-09 ENCOUNTER — Encounter (HOSPITAL_BASED_OUTPATIENT_CLINIC_OR_DEPARTMENT_OTHER): Payer: Self-pay | Admitting: Obstetrics & Gynecology

## 2023-08-12 ENCOUNTER — Other Ambulatory Visit (HOSPITAL_BASED_OUTPATIENT_CLINIC_OR_DEPARTMENT_OTHER): Payer: Self-pay | Admitting: Obstetrics & Gynecology

## 2023-08-12 DIAGNOSIS — F419 Anxiety disorder, unspecified: Secondary | ICD-10-CM

## 2023-08-12 DIAGNOSIS — G479 Sleep disorder, unspecified: Secondary | ICD-10-CM

## 2023-08-12 MED ORDER — ALPRAZOLAM 0.5 MG PO TABS: 0.5000 mg | ORAL_TABLET | Freq: Every evening | ORAL | 0 refills | Status: DC | PRN

## 2023-08-14 ENCOUNTER — Other Ambulatory Visit (HOSPITAL_BASED_OUTPATIENT_CLINIC_OR_DEPARTMENT_OTHER): Payer: Self-pay | Admitting: Obstetrics & Gynecology

## 2023-08-14 DIAGNOSIS — Z79890 Hormone replacement therapy (postmenopausal): Secondary | ICD-10-CM

## 2023-09-06 ENCOUNTER — Telehealth (HOSPITAL_BASED_OUTPATIENT_CLINIC_OR_DEPARTMENT_OTHER): Payer: Self-pay

## 2023-09-06 NOTE — Telephone Encounter (Signed)
 Patient called and requested a rx be sent to [facility] for the estradiol  compound. She is not completely out, but she wants to get the ball rolling. She will be going out of town and wants to pick it up by next Thursday. tbw

## 2023-11-06 ENCOUNTER — Encounter: Payer: Self-pay | Admitting: Family Medicine

## 2023-11-09 ENCOUNTER — Encounter (HOSPITAL_BASED_OUTPATIENT_CLINIC_OR_DEPARTMENT_OTHER): Payer: Self-pay | Admitting: Obstetrics & Gynecology

## 2023-11-11 ENCOUNTER — Other Ambulatory Visit (HOSPITAL_BASED_OUTPATIENT_CLINIC_OR_DEPARTMENT_OTHER): Payer: Self-pay | Admitting: Obstetrics & Gynecology

## 2023-11-11 DIAGNOSIS — F419 Anxiety disorder, unspecified: Secondary | ICD-10-CM

## 2023-11-11 MED ORDER — ALPRAZOLAM 0.5 MG PO TABS
0.5000 mg | ORAL_TABLET | Freq: Every evening | ORAL | 0 refills | Status: DC | PRN
Start: 2023-11-11 — End: 2024-03-06

## 2024-03-06 ENCOUNTER — Encounter: Payer: Self-pay | Admitting: Family Medicine

## 2024-03-06 ENCOUNTER — Ambulatory Visit: Admitting: Family Medicine

## 2024-03-06 VITALS — BP 110/60 | HR 85 | Temp 97.8°F | Ht 62.5 in | Wt 110.6 lb

## 2024-03-06 DIAGNOSIS — F4323 Adjustment disorder with mixed anxiety and depressed mood: Secondary | ICD-10-CM

## 2024-03-06 DIAGNOSIS — J0110 Acute frontal sinusitis, unspecified: Secondary | ICD-10-CM | POA: Diagnosis not present

## 2024-03-06 DIAGNOSIS — Z0001 Encounter for general adult medical examination with abnormal findings: Secondary | ICD-10-CM

## 2024-03-06 DIAGNOSIS — M810 Age-related osteoporosis without current pathological fracture: Secondary | ICD-10-CM | POA: Diagnosis not present

## 2024-03-06 DIAGNOSIS — Z13220 Encounter for screening for lipoid disorders: Secondary | ICD-10-CM | POA: Diagnosis not present

## 2024-03-06 DIAGNOSIS — H8103 Meniere's disease, bilateral: Secondary | ICD-10-CM

## 2024-03-06 DIAGNOSIS — F5102 Adjustment insomnia: Secondary | ICD-10-CM

## 2024-03-06 DIAGNOSIS — E559 Vitamin D deficiency, unspecified: Secondary | ICD-10-CM | POA: Diagnosis not present

## 2024-03-06 DIAGNOSIS — Z79890 Hormone replacement therapy (postmenopausal): Secondary | ICD-10-CM

## 2024-03-06 LAB — CBC WITH DIFFERENTIAL/PLATELET
Basophils Absolute: 0 K/uL (ref 0.0–0.1)
Basophils Relative: 0.6 % (ref 0.0–3.0)
Eosinophils Absolute: 0.3 K/uL (ref 0.0–0.7)
Eosinophils Relative: 6.8 % — ABNORMAL HIGH (ref 0.0–5.0)
HCT: 41.3 % (ref 36.0–46.0)
Hemoglobin: 14.2 g/dL (ref 12.0–15.0)
Lymphocytes Relative: 27.8 % (ref 12.0–46.0)
Lymphs Abs: 1.1 K/uL (ref 0.7–4.0)
MCHC: 34.5 g/dL (ref 30.0–36.0)
MCV: 92.8 fl (ref 78.0–100.0)
Monocytes Absolute: 0.3 K/uL (ref 0.1–1.0)
Monocytes Relative: 8.8 % (ref 3.0–12.0)
Neutro Abs: 2.2 K/uL (ref 1.4–7.7)
Neutrophils Relative %: 56 % (ref 43.0–77.0)
Platelets: 300 K/uL (ref 150.0–400.0)
RBC: 4.45 Mil/uL (ref 3.87–5.11)
RDW: 12.5 % (ref 11.5–15.5)
WBC: 3.9 K/uL — ABNORMAL LOW (ref 4.0–10.5)

## 2024-03-06 LAB — COMPREHENSIVE METABOLIC PANEL WITH GFR
ALT: 15 U/L (ref 0–35)
AST: 19 U/L (ref 0–37)
Albumin: 4.4 g/dL (ref 3.5–5.2)
Alkaline Phosphatase: 47 U/L (ref 39–117)
BUN: 20 mg/dL (ref 6–23)
CO2: 33 meq/L — ABNORMAL HIGH (ref 19–32)
Calcium: 9.4 mg/dL (ref 8.4–10.5)
Chloride: 99 meq/L (ref 96–112)
Creatinine, Ser: 0.72 mg/dL (ref 0.40–1.20)
GFR: 88.96 mL/min (ref >60.00)
Glucose, Bld: 100 mg/dL — ABNORMAL HIGH (ref 70–99)
Potassium: 4.3 meq/L (ref 3.5–5.1)
Sodium: 138 meq/L (ref 135–145)
Total Bilirubin: 0.4 mg/dL (ref 0.2–1.2)
Total Protein: 7.1 g/dL (ref 6.0–8.3)

## 2024-03-06 LAB — VITAMIN D 25 HYDROXY (VIT D DEFICIENCY, FRACTURES): VITD: 33.26 ng/mL (ref 30.00–100.00)

## 2024-03-06 LAB — LIPID PANEL
Cholesterol: 178 mg/dL (ref 0–200)
HDL: 56.9 mg/dL (ref >39.00)
LDL Cholesterol: 111 mg/dL — ABNORMAL HIGH (ref 0–99)
NonHDL: 121.1
Total CHOL/HDL Ratio: 3
Triglycerides: 51 mg/dL (ref 0.0–149.0)
VLDL: 10.2 mg/dL (ref 0.0–40.0)

## 2024-03-06 LAB — TSH: TSH: 2.09 u[IU]/mL (ref 0.35–5.50)

## 2024-03-06 MED ORDER — ALPRAZOLAM 0.5 MG PO TABS: 0.5000 mg | ORAL_TABLET | Freq: Every evening | ORAL | 0 refills | Status: AC | PRN

## 2024-03-06 MED ORDER — AMOXICILLIN-POT CLAVULANATE 875-125 MG PO TABS: 1.0000 | ORAL_TABLET | Freq: Two times a day (BID) | ORAL | 0 refills | Status: AC

## 2024-03-06 NOTE — Progress Notes (Signed)
 Subjective  Chief Complaint  Patient presents with   Annual Exam    Pt here for Annual Exam and is currently fasting     HPI: Jody Clarke is a 63 y.o. female who presents to [facility] [place] today for a Female Wellness Visit. She also has the concerns and/or needs as listed above in the chief complaint. These will be addressed in addition to the Health Maintenance Visit.   Wellness Visit: annual visit with health maintenance review and exam  HM: screens are all current. Mammo due soon. Sees Dr. Cleotilde GYN. On HRT, bioidentical. Reviewed labs. Declines flu vaccine and deferring shingrix and prevnar today; will consider after holidays. Healthy lifestyle.   Chronic disease f/u and/or acute problem visit: (deemed necessary to be done in addition to the wellness visit): Discussed the use of AI scribe software for clinical note transcription with the patient, who gave verbal consent to proceed.  History of Present Illness Jody Clarke is a 63 year old female who presents with sinus symptoms and for a wellness visit.  Sinusitissymptoms - Sinus pain localized to the forehead and face - Swollen nasal passages - Body aches and fatigue present since Sunday, limiting ability to exercise - No significant nasal discharge or discoloration but + PND - History of sinus issues and allergies - No history of frequent sinus infections - Utilizes saline spray and steam for symptomatic relief - no longer with body aches but frontal sinus pain perists. + fatigue w/ minimal malaise. No f/c/s or cough or headache  Hormone therapy and endocrine symptoms - Currently on hormone replacement therapy - Recent adjustment of estradiol  dosage due to breast pain - Uses compounded estradiol  cream and regular progesterone  - History of elevated cortisol levels during periods of stress - Previously trialed Corticare B, discontinued due to feeling 'hyped up' - Not currently taking  DHEA  Adjustment insomnia and anxiety Remains stable with intermittent xanax  use. No SE. No chronic mood problems. Due for refill.   Osteoporosis w/ most recent dexa reviewed: stable with lowest T at right fem neck -2.4 (from -2.5). on d and ca and exercise.  H/o d deficiency on supplements.  Menieires: no recent flares.   Supplement and medication use - Takes vitamin D3 and B complex supplement - Uses Xanax  intermittently, especially during stressful situations such as travel - Recent increase in Xanax  use during [place] cruise    Assessment  1. Encounter for well adult exam with abnormal findings   2. Age-related osteoporosis without current pathological fracture   3. Active cochleovestibular Meniere's disease of both ears   4. Vitamin D  deficiency   5. Postmenopausal HRT (hormone replacement therapy)   6. Adjustment disorder with anxiety   7. Acute non-recurrent frontal sinusitis   8. Adjustment insomnia      Plan  Female Wellness Visit: Age appropriate Health Maintenance and Prevention measures were discussed with patient. Included topics are cancer screening recommendations, ways to keep healthy (see AVS) including dietary and exercise recommendations, regular eye and dental care, use of seat belts, and avoidance of moderate alcohol use and tobacco use.  BMI: discussed patient's BMI and encouraged positive lifestyle modifications to help get to or maintain a target BMI. HM needs and immunizations were addressed and ordered. See below for orders. See HM and immunization section for updates. Routine labs and screening tests ordered including cmp, cbc and lipids where appropriate. Discussed recommendations regarding Vit D and calcium supplementation (see AVS)  Chronic disease management visit and/or acute problem visit: Assessment and Plan Assessment & Plan Adult Wellness Visit Routine wellness visit with discussion on fasting for labs, sinus issues, and immunizations. -  Ordered lab work - Discussed fasting options for labs - Discussed immunizations including shingles and Prevnar 20  Acute sinusitis Symptoms of sinus pain, fatigue, and swollen passages. Differential includes viral infection, bacterial infection, or allergies. Decision to treat with antibiotics due to significant pain and fatigue. - Prescribed antibiotic for sinusitis. Augmenting bid - Continue nasal sprays and steam inhalation - Follow up with Dr. Carlie for further evaluation  Age-related osteoporosis without current pathological fracture Bone density stable as of last January. Current management includes hormone therapy and exercise. - Continue current management with hormone therapy and exercise, ca d and exercise  Hormone replacement therapy Current regimen includes compounded estradiol  cream and micronized progesterone . Previous higher dose of estradiol  caused breast pain. Discussion on DHEA supplementation due to low levels. - Continue current hormone therapy regimen per gyn. We discuss benefits - Sent recommendation for DHEA supplementation - Discuss DHEA with Dr. Cleotilde if needed  Adjustment disorder with mixed anxiety and depressed mood Intermittent use of Xanax , increased during recent travel. Discussion on stress management and potential benefits of DHEA supplementation. - Prescribed Xanax  as needed - Consider DHEA supplementation for mood support  Adjustment insomnia: consider mag glycinate. Stable.   Menieres' stable; has f/u with Dr. Carlie.   Recheck vit D levels today   Follow up: 12 mo for cpe  Orders Placed This Encounter  Procedures   TSH   VITAMIN D  25 Hydroxy (Vit-D Deficiency, Fractures)   CBC with Differential/Platelet   Comprehensive metabolic panel with GFR   Lipid panel   Meds ordered this encounter  Medications   amoxicillin -clavulanate (AUGMENTIN ) 875-125 MG tablet    Sig: Take 1 tablet by mouth 2 (two) times daily for 10 days.    Dispense:  20  tablet    Refill:  0   ALPRAZolam  (XANAX ) 0.5 MG tablet    Sig: Take 1 tablet (0.5 mg total) by mouth at bedtime as needed for anxiety (vertigo).    Dispense:  30 tablet    Refill:  0      Body mass index is 19.91 kg/m. Wt Readings from Last 3 Encounters:  03/06/24 110 lb 9.6 oz (50.2 kg)  05/29/23 110 lb 3.2 oz (50 kg)  04/08/23 110 lb 3.2 oz (50 kg)     Patient Active Problem List   Diagnosis Date Noted   Cochleovestibular active Meniere's disease 05/03/2015    Priority: High   Insomnia 05/03/2015    Priority: High   Osteoporosis 07/28/2020    Priority: Medium     Dexa 05/2023: lowest T = -2.4 left femur neck; stable osteopenia. Recheck 2 years.  08/2020 Dexa, Dr. Ronal Cleotilde Site Region Measured Date Measured Age YA BMD Significant CHANGE T-score: osteoporosis, worsening from osteopenia in 2019; Dr. Cleotilde elects to recheck in 2-3 years.  DualFemur Neck Right 09/06/2020    59.9         -2.5    0.687 g/cm2 AP Spine  L1-L4      09/06/2020    59.9         -0.9    1.088 g/cm2 DualFemur Total Mean 09/06/2020    59.9         -2.4    0.708 g/cm2 04/2023: lowest T = -2.4 at Rfemur neck; stable. Recheck 2 years. Routine guidance  Recurrent aphthous ulcer 07/03/2019    Priority: Medium    Postmenopausal HRT (hormone replacement therapy) 09/10/2018    Priority: Medium    Adjustment disorder with anxiety 05/03/2015    Priority: Medium     Prn xanax     Asymmetrical left sensorineural hearing loss 05/03/2015    Priority: Medium    Chronic allergic rhinitis 09/10/2018    Priority: Low   Vitamin D  deficiency 05/03/2015    Priority: Low   Deviated nasal septum 05/03/2015    Priority: Low   Health Maintenance  Topic Date Due   DTaP/Tdap/Td (1 - Tdap) Never done   Mammogram  03/10/2024   COVID-19 Vaccine (4 - 2025-26 season) 03/22/2024 (Originally 12/16/2023)   Zoster Vaccines- Shingrix (1 of 2) 06/06/2024 (Originally 09/21/2010)   Influenza Vaccine  07/14/2024 (Originally  11/15/2023)   Pneumococcal Vaccine: 50+ Years (1 of 1 - PCV) 03/06/2025 (Originally 09/21/2010)   Fecal DNA (Cologuard)  01/02/2025   Bone Density Scan  04/24/2025   Cervical Cancer Screening (HPV/Pap Cotest)  03/27/2028   Hepatitis C Screening  Completed   Hepatitis B Vaccines 19-59 Average Risk  Aged Out   HPV VACCINES  Aged Out   Meningococcal B Vaccine  Aged Out   HIV Screening  Discontinued   Immunization History  Administered Date(s) Administered   PFIZER(Purple Top)SARS-COV-2 Vaccination 07/15/2019, 07/28/2019, 04/20/2020   We updated and reviewed the patient's past history in detail and it is documented below. Allergies: Patient is allergic to sulfa antibiotics and ciprofloxacin . Past Medical History Patient  has a past medical history of Allergy , Anxiety, Meniere's disease, Osteopenia, and Post-menopause on HRT (hormone replacement therapy) (09/10/2018). Past Surgical History Patient  has a past surgical history that includes inner ear endolymphatic sac operation with shunt (Left, 2014); Tonsillectomy; Knee surgery; Knee arthroscopy; Endolymphatic shunt decompression (8/14); Cataract extraction (Left, 2021); and Breast biopsy (Left, 04/19/2023). Family History: Patient family history includes Atrial fibrillation in her father; Cancer in her father; Osteoporosis in her mother. Social History:  Patient  reports that she has never smoked. She has never been exposed to tobacco smoke. She has never used smokeless tobacco. She reports current alcohol use. She reports that she does not use drugs.  Review of Systems: Constitutional: negative for fever or malaise Ophthalmic: negative for photophobia, double vision or loss of vision Cardiovascular: negative for chest pain, dyspnea on exertion, or new LE swelling Respiratory: negative for SOB or persistent cough Gastrointestinal: negative for abdominal pain, change in bowel habits or melena Genitourinary: negative for dysuria or gross  hematuria, no abnormal uterine bleeding or disharge Musculoskeletal: negative for new gait disturbance or muscular weakness Integumentary: negative for new or persistent rashes, no breast lumps Neurological: negative for TIA or stroke symptoms Psychiatric: negative for SI or delusions Allergic/Immunologic: negative for hives  Patient Care Team    Relationship Specialty Notifications Start End  Jodie Lavern CROME, MD PCP - General Family Medicine  09/10/18   Cleotilde Ronal RAMAN, MD Consulting Physician Gynecology  09/10/18   Carlie Clark, MD Consulting Physician Otolaryngology  09/10/18   Cary Doffing, MD Consulting Physician Dermatology  10/09/18   Luke Orlan HERO, DO Consulting Physician Allergy   03/06/23     Objective  Vitals: BP 110/60   Pulse 85   Temp 97.8 F (36.6 C)   Ht 5' 2.5 (1.588 m)   Wt 110 lb 9.6 oz (50.2 kg)   LMP 04/16/2008   SpO2 98%   BMI 19.91 kg/m  General:  Well developed,  well nourished, no acute distress  Psych:  Alert and orientedx3,normal mood and affect HEENT:  Normocephalic, atraumatic, non-icteric sclera,  supple neck without adenopathy, mass or thyromegaly, frontal sinus ttp present Cardiovascular:  Normal S1, S2, RRR without gallop, rub or murmur Respiratory:  Good breath sounds bilaterally, CTAB with normal respiratory effort Gastrointestinal: normal bowel sounds, soft, non-tender, no noted masses. No HSM MSK: extremities without edema, joints without erythema or swelling Neurologic:    Mental status is normal.  Gross motor and sensory exams are normal.  No tremor  Commons side effects, risks, benefits, and alternatives for medications and treatment plan prescribed today were discussed, and the patient expressed understanding of the given instructions. Patient is instructed to call or message via MyChart if he/she has any questions or concerns regarding our treatment plan. No barriers to understanding were identified. We discussed Red Flag symptoms and signs in  detail. Patient expressed understanding regarding what to do in case of urgent or emergency type symptoms.  Medication list was reconciled, printed and provided to the patient in AVS. Patient instructions and summary information was reviewed with the patient as documented in the AVS. This note was prepared with assistance of Dragon voice recognition software. Occasional wrong-word or sound-a-like substitutions may have occurred due to the inherent limitations of voice recognition software

## 2024-03-09 ENCOUNTER — Encounter: Payer: Managed Care, Other (non HMO) | Admitting: Family Medicine

## 2024-03-17 ENCOUNTER — Encounter: Admitting: Family Medicine

## 2024-03-19 ENCOUNTER — Ambulatory Visit: Payer: Self-pay | Admitting: Family Medicine

## 2024-03-19 NOTE — Progress Notes (Signed)
 Labs reviewed.  The 10-year ASCVD risk score (Arnett DK, et al., 2019) is: 3.1%   Values used to calculate the score:     Age: 63 years     Clincally relevant sex: Female     Is Non-Hispanic African American: No     Diabetic: No     Tobacco smoker: No     Systolic Blood Pressure: 110 mmHg     Is BP treated: No     HDL Cholesterol: 56.9 mg/dL     Total Cholesterol: 178 mg/dL

## 2024-03-26 ENCOUNTER — Ambulatory Visit: Payer: Self-pay

## 2024-03-26 ENCOUNTER — Telehealth: Admitting: Adult Health

## 2024-03-26 DIAGNOSIS — J019 Acute sinusitis, unspecified: Secondary | ICD-10-CM

## 2024-03-26 NOTE — Progress Notes (Signed)
 Virtual Visit via Video Note  I connected with [name] on 03/26/2024 at  1:00 PM EST by a video enabled telemedicine application and verified that I am speaking with the correct person using two identifiers.  Location patient: home Location provider:work or home office Persons participating in the virtual visit: patient, provider  I discussed the limitations of evaluation and management by telemedicine and the availability of in person appointments. The patient expressed understanding and agreed to proceed.   HPI:  Discussed the use of AI scribe software for clinical note transcription with the patient, who gave verbal consent to proceed.  History of Present Illness   Jody Clarke is a 63 year old female who presents with sinus congestion, intense headache, and low-grade fever.  Symptoms began two days ago y after an ENT wellness visit, with congestion, intense frontal headache over the eyes and forehead, sinus pressure, and low-grade fever. She wonders if this is a viral infection versus recurrence of recent sinusitis.  Two to three weeks ago she completed a 10-day course of amoxicillin  for sinus pain, with the last dose on December 1 and about 10 days off antibiotics before this illness.  She has thick nasal discharge but no significant cough. Fever has been low grade, and she takes Advil for headache, which may also reduce fever.  She uses AYR saline drops and steam compresses with partial relief. She usually takes Flonase  and Zyrtec  but is not using them currently and has not tried other saline treatments.  No household contacts are ill. She felt well before symptoms started the afternoon after the ENT visit.       ROS: See pertinent positives and negatives per HPI.  Past Medical History:  Diagnosis Date   Allergy     Allergic rhinitis   Anxiety    Meniere's disease    Osteopenia    Post-menopause on HRT (hormone replacement therapy) 09/10/2018    Past Surgical  History:  Procedure Laterality Date   BREAST BIOPSY Left 04/19/2023   US  LT BREAST BX W LOC DEV 1ST LESION IMG BX SPEC US  GUIDE 04/19/2023 [facility]MO[facility]   CATARACT EXTRACTION Left 2021   ENDOLYMPHATIC SHUNT DECOMPRESSION  8/14   in [place]   inner ear endolymphatic sac operation with shunt Left 2014   KNEE ARTHROSCOPY     right   KNEE SURGERY     TONSILLECTOMY      Family History  Problem Relation Age of Onset   Osteoporosis Mother    Cancer Father    Atrial fibrillation Father    Breast cancer Neg Hx       Current Medications[1]  EXAM:  VITALS per patient if applicable:  GENERAL: alert, oriented, appears well and in no acute distress  HEENT: atraumatic, conjunttiva clear, no obvious abnormalities on inspection of external nose and ears  NECK: normal movements of the head and neck  LUNGS: on inspection no signs of respiratory distress, breathing rate appears normal, no obvious gross SOB, gasping or wheezing  CV: no obvious cyanosis  MS: moves all visible extremities without noticeable abnormality  PSYCH/NEURO: pleasant and cooperative, no obvious depression or anxiety, speech and thought processing grossly intact  ASSESSMENT AND PLAN:  Discussed the following assessment and plan:  Assessment and Plan    Acute sinusitis Symptoms suggest viral sinusitis due to recent trends and presentation. Bacterial sinusitis less likely without persistent symptoms. - Continue Advil for pain and fever. - Use saline drops and steam compresses for congestion. -  Monitor symptoms for five days; follow up if no improvement.           I discussed the assessment and treatment plan with the patient. The patient was provided an opportunity to ask questions and all were answered. The patient agreed with the plan and demonstrated an understanding of the instructions.   The patient was advised to call back or seek an in-person evaluation if the symptoms worsen or if the  condition fails to improve as anticipated.   Darleene Shape, NP     [1]  Current Outpatient Medications:    ALPRAZolam  (XANAX ) 0.5 MG tablet, Take 1 tablet (0.5 mg total) by mouth at bedtime as needed for anxiety (vertigo)., Disp: 30 tablet, Rfl: 0   azelastine (ASTELIN) 0.1 % nasal spray, daily as needed., Disp: , Rfl: 5   b complex vitamins capsule, Take 1 capsule by mouth daily., Disp: , Rfl:    cetirizine  (ZYRTEC ) 10 MG tablet, Take 10 mg by mouth daily., Disp: , Rfl:    Cholecalciferol (VITAMIN D3) 25 MCG (1000 UT) CAPS, Take by mouth., Disp: , Rfl:    estradiol  (ESTRACE ) 0.1 MG/GM vaginal cream, 1 gram vaginally twice weekly, Disp: 42.5 g, Rfl: 1   estradiol  (ESTRACE ) 1 MG tablet, Take 1 tablet (1 mg total) by mouth daily., Disp: 30 tablet, Rfl: 0   Fluticasone  Propionate (XHANCE ) 93 MCG/ACT EXHU, 1-2 sprays per nostril twice a day., Disp: 32 mL, Rfl: 5   progesterone  (PROMETRIUM ) 200 MG capsule, TAKE 1 CAPSULE BY MOUTH EVERY DAY, Disp: 90 capsule, Rfl: 0   tretinoin (RETIN-A) 0.05 % cream, Apply 1 application  topically daily., Disp: , Rfl:

## 2024-03-26 NOTE — Telephone Encounter (Signed)
 FYI Only or Action Required?: FYI only for provider: appointment scheduled on 03/26/24.  Patient was last seen in primary care on 03/06/2024 by Jodie Lavern CROME, MD.  Called Nurse Triage reporting Sinusitis and Headache.  Symptoms began several days ago.  Interventions attempted: OTC medications: Advil and afrin and saline.  Symptoms are: gradually worsening.  Triage Disposition: See PCP When Office is Open (Within 3 Days)  Patient/caregiver understands and will follow disposition?: Yes     Copied from CRM #[id number]. Topic: Clinical - Red Word Triage >> Mar 26, 2024 10:51 AM Charolett L wrote: [facility] that prompted transfer to Nurse Triage: fever, congestion, head pressure Reason for Disposition  [1] Sinus congestion (pressure, fullness) AND [2] present > 10 days  Answer Assessment - Initial Assessment Questions Went to wellness visit 2 weeks ago. Reported Sinus pain and pressure x2 weeks at that time. Prescribed amoxicillin  x 10 days which resolves symptoms. 2 days later (12/9) onset of congestion and low grade temp with sinus pressure and headache. 8/10 headache this morning, improved with ibuprofen. Temp 99.2 F. Denies cough or body aches. Scheduled virtual appt with provider at different office in pt region d/t no availability at home office with any provider within timeframe. Confirmed pt has access to mychart and ability to do virtual visit. Advised UC for worsening symptoms.    1. LOCATION: Where does it hurt?      Headache and sinuses 2. ONSET: When did the sinus pain start?  (e.g., hours, days)      2 days ago 3. SEVERITY: How bad is the pain?   (Scale 0-10; or none, mild, moderate or severe)     8/10 4. RECURRENT SYMPTOM: Have you ever had sinus problems before? If Yes, ask: When was the last time? and What happened that time?      Yes, in the past. Not chronic. 5. NASAL CONGESTION: Is the nose blocked? If Yes, ask: Can you open it or must you breathe  through your mouth?     Able to breathe through it. 6. NASAL DISCHARGE: Do you have discharge from your nose? If so ask, What color?     Small amount. Clear yellow. No blood or green. 7. FEVER: Do you have a fever? If Yes, ask: What is it, how was it measured, and when did it start?      99.2 F 8. OTHER SYMPTOMS: Do you have any other symptoms? (e.g., sore throat, cough, earache, difficulty breathing)     Denies  Protocols used: Sinus Pain or Congestion-A-AH

## 2024-03-30 ENCOUNTER — Telehealth (HOSPITAL_BASED_OUTPATIENT_CLINIC_OR_DEPARTMENT_OTHER): Payer: Self-pay

## 2024-03-30 ENCOUNTER — Other Ambulatory Visit (HOSPITAL_BASED_OUTPATIENT_CLINIC_OR_DEPARTMENT_OTHER): Payer: Self-pay

## 2024-03-30 ENCOUNTER — Other Ambulatory Visit (HOSPITAL_BASED_OUTPATIENT_CLINIC_OR_DEPARTMENT_OTHER): Payer: Self-pay | Admitting: Obstetrics & Gynecology

## 2024-03-30 DIAGNOSIS — Z79890 Hormone replacement therapy (postmenopausal): Secondary | ICD-10-CM

## 2024-03-30 NOTE — Telephone Encounter (Signed)
 Called [facility] and gave verbal authorization for refills of Biest and Progesterone .   Morna LOISE Quale, RN

## 2024-03-31 ENCOUNTER — Ambulatory Visit (HOSPITAL_BASED_OUTPATIENT_CLINIC_OR_DEPARTMENT_OTHER): Payer: Managed Care, Other (non HMO) | Admitting: Obstetrics & Gynecology

## 2024-04-10 ENCOUNTER — Telehealth: Payer: Self-pay | Admitting: Family Medicine

## 2024-04-10 ENCOUNTER — Ambulatory Visit
Admission: EM | Admit: 2024-04-10 | Discharge: 2024-04-10 | Disposition: A | Discharge status: Home / Self Care | Attending: Family Medicine | Admitting: Family Medicine

## 2024-04-10 ENCOUNTER — Encounter: Payer: Self-pay | Admitting: Emergency Medicine

## 2024-04-10 ENCOUNTER — Ambulatory Visit: Payer: Self-pay

## 2024-04-10 DIAGNOSIS — R0981 Nasal congestion: Secondary | ICD-10-CM | POA: Diagnosis not present

## 2024-04-10 DIAGNOSIS — J0100 Acute maxillary sinusitis, unspecified: Secondary | ICD-10-CM | POA: Diagnosis not present

## 2024-04-10 MED ORDER — AMOXICILLIN-POT CLAVULANATE 875-125 MG PO TABS: 1.0000 | ORAL_TABLET | Freq: Two times a day (BID) | ORAL | 0 refills | Status: AC

## 2024-04-10 MED ORDER — PREDNISONE 50 MG PO TABS: ORAL_TABLET | ORAL | 0 refills | Status: DC

## 2024-04-10 NOTE — Telephone Encounter (Signed)
 Called Patient to schedule appointment. Patient states she went to Urgent Care.

## 2024-04-10 NOTE — ED Provider Notes (Signed)
 " [facility]    CSN: [id number] Arrival date & time: 04/10/24  0934      History   Chief Complaint Chief Complaint  Patient presents with   Headache    HPI Jody Clarke is a 63 y.o. female.   HPI 63 year old female presents with headache sinus nasal congestion for 3 to 4 days.  Reports evaluated by ENT 1 month ago and prescribed amoxicillin ..  PMH significant for adjustment disorder with anxiety, left sensorineural hearing loss, and cochleovestibular active Mnire's disease.  Past Medical History:  Diagnosis Date   Allergy     Allergic rhinitis   Anxiety    Meniere's disease    Osteopenia    Post-menopause on HRT (hormone replacement therapy) 09/10/2018    Patient Active Problem List   Diagnosis Date Noted   Osteoporosis 07/28/2020   Recurrent aphthous ulcer 07/03/2019   Postmenopausal HRT (hormone replacement therapy) 09/10/2018   Chronic allergic rhinitis 09/10/2018   Adjustment disorder with anxiety 05/03/2015   Cochleovestibular active Meniere's disease 05/03/2015   Asymmetrical left sensorineural hearing loss 05/03/2015   Insomnia 05/03/2015   Vitamin D  deficiency 05/03/2015   Deviated nasal septum 05/03/2015    Past Surgical History:  Procedure Laterality Date   BREAST BIOPSY Left 04/19/2023   US  LT BREAST BX W LOC DEV 1ST LESION IMG BX SPEC US  GUIDE 04/19/2023 [facility]   CATARACT EXTRACTION Left 2021   ENDOLYMPHATIC SHUNT DECOMPRESSION  8/14   in [place]   inner ear endolymphatic sac operation with shunt Left 2014   KNEE ARTHROSCOPY     right   KNEE SURGERY     TONSILLECTOMY      OB History     Gravida  2   Para  1   Term  1   Preterm  0   AB  1   Living  1      SAB  1   IAB  0   Ectopic  0   Multiple      Live Births  1            Home Medications    Prior to Admission medications  Medication Sig Start Date End Date Taking? Authorizing Provider  ALPRAZolam  (XANAX ) 0.5 MG tablet Take 1  tablet (0.5 mg total) by mouth at bedtime as needed for anxiety (vertigo). 03/06/24  Yes Jodie Lavern CROME, MD  amoxicillin -clavulanate (AUGMENTIN ) 875-125 MG tablet Take 1 tablet by mouth 2 (two) times daily for 10 days. 04/10/24 04/20/24 Yes Teddy Sharper, FNP  azelastine (ASTELIN) 0.1 % nasal spray daily as needed. 01/23/18  Yes [provider]  b complex vitamins capsule Take 1 capsule by mouth daily.   Yes [provider]  cetirizine  (ZYRTEC ) 10 MG tablet Take 10 mg by mouth daily.   Yes [provider]  Cholecalciferol (VITAMIN D3) 25 MCG (1000 UT) CAPS Take by mouth.   Yes [provider]  estradiol  (ESTRACE ) 0.1 MG/GM vaginal cream 1 gram vaginally twice weekly 03/28/23  Yes Cleotilde Ronal RAMAN, MD  estradiol  (ESTRACE ) 1 MG tablet Take 1 tablet (1 mg total) by mouth daily. 05/20/23  Yes Cleotilde Ronal RAMAN, MD  predniSONE  (DELTASONE ) 50 MG tablet Take 1 tab p.o. daily for 5 days. 04/10/24  Yes Teddy Sharper, FNP  progesterone  (PROMETRIUM ) 200 MG capsule Take 1 capsule by mouth every evening at bedtime. 03/30/24  Yes Cleotilde Ronal RAMAN, MD  tretinoin (RETIN-A) 0.05 % cream Apply 1 application  topically daily.  01/15/23  Yes [provider]  triamterene-hydrochlorothiazide (DYAZIDE) 37.5-25 MG capsule Take 1 capsule by mouth daily. 02/13/24  Yes [provider]  Fluticasone  Propionate (XHANCE ) 93 MCG/ACT EXHU 1-2 sprays per nostril twice a day. 03/20/23   Luke Orlan HERO, DO    Family History Family History  Problem Relation Age of Onset   Osteoporosis Mother    Cancer Father    Atrial fibrillation Father    Breast cancer Neg Hx     Social History Social History[1]   Allergies   Sulfa antibiotics and Ciprofloxacin    Review of Systems Review of Systems  HENT:  Positive for congestion, sinus pressure and sinus pain.   All other systems reviewed and are negative.    Physical Exam Triage Vital Signs ED Triage Vitals  Encounter Vitals Group      BP      Girls Systolic BP Percentile      Girls Diastolic BP Percentile      Boys Systolic BP Percentile      Boys Diastolic BP Percentile      Pulse      Resp      Temp      Temp src      SpO2      Weight      Height      Head Circumference      Peak Flow      Pain Score      Pain Loc      Pain Education      Exclude from Growth Chart    No data found.  Updated Vital Signs BP 116/72 (BP Location: Right Arm)   Pulse 80   Temp 98.4 F (36.9 C) (Oral)   Resp 18   Ht 5' 3.5 (1.613 m)   Wt 110 lb (49.9 kg)   LMP 04/16/2008   SpO2 98%   BMI 19.18 kg/m   Visual Acuity Right Eye Distance:   Left Eye Distance:   Bilateral Distance:    Right Eye Near:   Left Eye Near:    Bilateral Near:     Physical Exam Vitals and nursing note reviewed.  Constitutional:      Appearance: Normal appearance. She is normal weight.  HENT:     Head: Normocephalic and atraumatic.     Right Ear: Tympanic membrane and external ear normal.     Left Ear: Tympanic membrane and external ear normal.     Ears:     Comments: Moderate eustachian tube dysfunction noted bilaterally    Nose:     Right Sinus: Maxillary sinus tenderness present.     Left Sinus: Maxillary sinus tenderness present.     Comments: Turbinates are erythematous/edematous    Mouth/Throat:     Mouth: Mucous membranes are moist.     Pharynx: Oropharynx is clear.  Eyes:     Extraocular Movements: Extraocular movements intact.     Conjunctiva/sclera: Conjunctivae normal.     Pupils: Pupils are equal, round, and reactive to light.  Cardiovascular:     Rate and Rhythm: Normal rate and regular rhythm.     Heart sounds: Normal heart sounds.  Pulmonary:     Effort: Pulmonary effort is normal.     Breath sounds: Normal breath sounds. No wheezing, rhonchi or rales.  Musculoskeletal:        General: Normal range of motion.  Skin:    General: Skin is warm and dry.  Neurological:     General: No  focal deficit present.      Mental Status: She is alert and oriented to person, place, and time.  Psychiatric:        Mood and Affect: Mood normal.        Behavior: Behavior normal.      UC Treatments / Results  Labs (all labs ordered are listed, but only abnormal results are displayed) Labs Reviewed - No data to display  EKG   Radiology No results found.  Procedures Procedures (including critical care time)  Medications Ordered in UC Medications - No data to display  Initial Impression / Assessment and Plan / UC Course  I have reviewed the triage vital signs and the nursing notes.  Pertinent labs & imaging results that were available during my care of the patient were reviewed by me and considered in my medical decision making (see chart for details).     MDM: 1.  Acute nonrecurrent maxillary sinusitis-Rx'd Augmentin  875/125 mg tablet: Take 1 tablet twice daily x 10 days; 2.  Nasal sinus congestion-Rx'd prednisone  50 mg tablet: Take 1 tablet p.o. daily x 5 days. Advised patient take medications as directed with food to completion.  Advised take prednisone  with first dose of Augmentin  for the next 5 of 10 days.  Encouraged increase daily water intake to 64 ounces per day while taking these medications.  Advised if symptoms worsen and/or unresolved please follow-up with your PCP, ENT, or here for further evaluation.  Patient discharged home, hemodynamically stable Final Clinical Impressions(s) / UC Diagnoses   Final diagnoses:  Acute non-recurrent maxillary sinusitis  Nasal sinus congestion     Discharge Instructions      Advised patient take medications as directed with food to completion.  Advised take prednisone  with first dose of Augmentin  for the next 5 of 10 days.  Encouraged increase daily water intake to 64 ounces per day while taking these medications.  Advised if symptoms worsen and/or unresolved please follow-up with your PCP, ENT, or here for further evaluation.     ED Prescriptions      Medication Sig Dispense Auth. Provider   amoxicillin -clavulanate (AUGMENTIN ) 875-125 MG tablet Take 1 tablet by mouth 2 (two) times daily for 10 days. 20 tablet Kiyon Fidalgo, FNP   predniSONE  (DELTASONE ) 50 MG tablet Take 1 tab p.o. daily for 5 days. 5 tablet Gaye Scorza, FNP      PDMP not reviewed this encounter.    [1]  Social History Tobacco Use   Smoking status: Never    Passive exposure: Never   Smokeless tobacco: Never  Vaping Use   Vaping status: Never Used  Substance Use Topics   Alcohol use: Yes    Alcohol/week: 0.0 - 1.0 standard drinks of alcohol   Drug use: No     Teddy Sharper, FNP 04/10/24 1107  "

## 2024-04-10 NOTE — ED Triage Notes (Signed)
 Patient was treated for a sinus w/Amoxil  x 10 days a month ago.  Once antibiotics were completed she followed w/her ENT and advised that it was probably viral and follow up as needed.  This past Monday, patient started with frontal head pain, some bloody drainage/irritation from nose, no fever but having body chills.  Patient has taken Advil Sinus Congestion and nasal rinses.

## 2024-04-10 NOTE — Discharge Instructions (Addendum)
 Advised patient take medications as directed with food to completion.  Advised take prednisone  with first dose of Augmentin  for the next 5 of 10 days.  Encouraged increase daily water intake to 64 ounces per day while taking these medications.  Advised if symptoms worsen and/or unresolved please follow-up with your PCP, ENT, or here for further evaluation.

## 2024-04-10 NOTE — Telephone Encounter (Signed)
 Sinus infection like symptoms. ENT will not be in until Monday and she states she can't wait until then. She does not want to go to an urgent care, does not want to schedule a virtual visit, does not want to go to an alternate PCP. Pt advised that PCP will call her back if any availability opens up.   FYI Only or Action Required?: Action required by provider: request for appointment.  Patient was last seen in primary care on 03/26/2024 by Merna Huxley, NP.  Called Nurse Triage reporting Sinusitis.  Symptoms began several days ago.  Interventions attempted: OTC medications: advil sinus.  Symptoms are: gradually worsening.  Triage Disposition: Home Care  Patient/caregiver understands and will follow disposition?:   Copied from CRM [id number]. Topic: Clinical - Red Word Triage >> Apr 10, 2024  8:40 AM Viola FALCON wrote: Red Word that prompted transfer to Nurse Triage: Patient having head pain, congestion and bleeding from sinuses - requesting appt Reason for Disposition  [1] Sinus congestion as part of a cold AND [2] present < 10 days  Answer Assessment - Initial Assessment Questions 1. LOCATION: Where does it hurt?      Feels pressure to ears, head  2. ONSET: When did the sinus pain start?  (e.g., hours, days)      Had URI 12/9, started to feel better, 12/22 began to feel worse  3. SEVERITY: How bad is the pain?   (Scale 0-10; or none, mild, moderate or severe)     8/10 without Advil per patient  4. RECURRENT SYMPTOM: Have you ever had sinus problems before? If Yes, ask: When was the last time? and What happened that time?      Denies  5. NASAL CONGESTION:      Denies  6. NASAL DISCHARGE: Do you have discharge from your nose? If so ask, What color?     A little bit of bleeding but no discharge  7. FEVER: Do you have a fever? If Yes, ask: What is it, how was it measured, and when did it start?      Denies  8. OTHER SYMPTOMS: Do you have any other  symptoms? (e.g., sore throat, cough, earache, difficulty breathing)     Head pressure, malaise  12/9 symptoms began, was seen by ENT and diagnosed with URI 12/22- had been feeling better, then felt chills without fever, pressure to head and ears, fatigued, is now taking advil sinus ENT will not be in until Monday and she states she can't wait until then. She does not want to go to an urgent care, does not want to schedule a virtual visit, does not want to go to an alternate PCP. Pt advised I will route this back to PCP and see if they can squeeze her in.  Protocols used: Sinus Pain or Congestion-A-AH

## 2024-04-10 NOTE — Telephone Encounter (Signed)
 Noted

## 2024-05-05 ENCOUNTER — Other Ambulatory Visit (HOSPITAL_BASED_OUTPATIENT_CLINIC_OR_DEPARTMENT_OTHER): Payer: Self-pay | Admitting: Certified Nurse Midwife

## 2024-05-05 ENCOUNTER — Ambulatory Visit (HOSPITAL_BASED_OUTPATIENT_CLINIC_OR_DEPARTMENT_OTHER): Admitting: Obstetrics & Gynecology

## 2024-05-05 DIAGNOSIS — Z79890 Hormone replacement therapy (postmenopausal): Secondary | ICD-10-CM

## 2024-05-05 NOTE — Progress Notes (Unsigned)
 "  ANNUAL EXAM Patient name: Jody Clarke MRN [id number]  Date of birth: 1960/10/08 Chief Complaint:   No chief complaint on file.  History of Present Illness:   Jody Clarke is a 64 y.o. G97P1011 Caucasian female being seen today for a routine annual exam.  Current complaints: ***  Patient's last menstrual period was 04/16/2008.   The pregnancy intention screening data noted above was reviewed. Potential methods of contraception were discussed. The patient elected to proceed with No data recorded.   Last pap 03/28/2023. Results were: NILM w/ HRHPV negative. H/O abnormal pap: no Last mammogram: ***. Results were: {normal, abnormal, n/a:23837}. Family h/o breast cancer: {yes***/no:23838} Last colonoscopy: 01/02/2022 Cologuard. Results were: normal. Family h/o colorectal cancer: {yes***/no:23838}     03/06/2024    8:36 AM 05/29/2023    1:07 PM 03/28/2023    3:17 PM 03/06/2023    1:18 PM 12/07/2021    1:29 PM  Depression screen PHQ 2/9  Decreased Interest 0 0 0 0 0  Down, Depressed, Hopeless 0 0 0 0 0  PHQ - 2 Score 0 0 0 0 0        03/06/2024    8:36 AM 03/06/2023    1:18 PM  GAD 7 : Generalized Anxiety Score  Nervous, Anxious, on Edge 2  0   Control/stop worrying 1  0   Worry too much - different things 1  0   Trouble relaxing 2  0   Restless 1  0   Easily annoyed or irritable 0  0   Afraid - awful might happen 0  0   Total GAD 7 Score 7 0  Anxiety Difficulty  Not difficult at all     Data saved with a previous flowsheet row definition     Review of Systems:   Pertinent items are noted in HPI Denies any headaches, blurred vision, fatigue, shortness of breath, chest pain, abdominal pain, abnormal vaginal discharge/itching/odor/irritation, problems with periods, bowel movements, urination, or intercourse unless otherwise stated above. Pertinent History Reviewed:  Reviewed past medical,surgical, social and family history.  Reviewed problem list, medications and  allergies. Physical Assessment:  There were no vitals filed for this visit.There is no height or weight on file to calculate BMI.        Physical Examination:   General appearance - well appearing, and in no distress  Mental status - alert, oriented to person, place, and time  Psych:  She has a normal mood and affect  Skin - warm and dry, normal color, no suspicious lesions noted  Chest - effort normal, all lung fields clear to auscultation bilaterally  Heart - normal rate and regular rhythm  Neck:  midline trachea, no thyromegaly or nodules  Breasts - breasts appear normal, no suspicious masses, no skin or nipple changes or  axillary nodes  Abdomen - soft, nontender, nondistended, no masses or organomegaly  Pelvic - VULVA: normal appearing vulva with no masses, tenderness or lesions  VAGINA: normal appearing vagina with normal color and discharge, no lesions  CERVIX: normal appearing cervix without discharge or lesions, no CMT  Thin prep pap is {Desc; done/not:10129} *** HR HPV cotesting  UTERUS: uterus is felt to be normal size, shape, consistency and nontender   ADNEXA: No adnexal masses or tenderness noted.  Rectal - normal rectal, good sphincter tone, no masses felt. Hemoccult: ***  Extremities:  No swelling or varicosities noted  Chaperone present for exam  No results found for this or  any previous visit (from the past 24 hours).  Assessment & Plan:  1) Well-Woman Exam  2) ***  Labs/procedures today: ***  Mammogram: {Mammo f/u:25212::@ 64yo}, or sooner if problems Colonoscopy: {TCS f/u:25213::@ 64yo}, or sooner if problems  No orders of the defined types were placed in this encounter.   Meds: No orders of the defined types were placed in this encounter.   Follow-up: No follow-ups on file.  Morna LOISE Quale, RN 05/05/2024 9:18 AM "

## 2024-05-07 ENCOUNTER — Ambulatory Visit (HOSPITAL_BASED_OUTPATIENT_CLINIC_OR_DEPARTMENT_OTHER): Payer: Self-pay | Admitting: Obstetrics & Gynecology

## 2024-05-07 ENCOUNTER — Other Ambulatory Visit (HOSPITAL_BASED_OUTPATIENT_CLINIC_OR_DEPARTMENT_OTHER): Payer: Self-pay | Admitting: Obstetrics & Gynecology

## 2024-05-07 ENCOUNTER — Encounter (HOSPITAL_BASED_OUTPATIENT_CLINIC_OR_DEPARTMENT_OTHER): Payer: Self-pay | Admitting: Obstetrics & Gynecology

## 2024-05-07 VITALS — BP 103/71 | HR 88 | Ht 63.5 in | Wt 107.8 lb

## 2024-05-07 DIAGNOSIS — N952 Postmenopausal atrophic vaginitis: Secondary | ICD-10-CM

## 2024-05-07 DIAGNOSIS — M85851 Other specified disorders of bone density and structure, right thigh: Secondary | ICD-10-CM | POA: Diagnosis not present

## 2024-05-07 DIAGNOSIS — Z79890 Hormone replacement therapy (postmenopausal): Secondary | ICD-10-CM

## 2024-05-07 DIAGNOSIS — H8102 Meniere's disease, left ear: Secondary | ICD-10-CM

## 2024-05-07 DIAGNOSIS — E559 Vitamin D deficiency, unspecified: Secondary | ICD-10-CM | POA: Diagnosis not present

## 2024-05-07 DIAGNOSIS — Z1331 Encounter for screening for depression: Secondary | ICD-10-CM | POA: Diagnosis not present

## 2024-05-07 DIAGNOSIS — Z01419 Encounter for gynecological examination (general) (routine) without abnormal findings: Secondary | ICD-10-CM | POA: Diagnosis not present

## 2024-05-07 DIAGNOSIS — M85852 Other specified disorders of bone density and structure, left thigh: Secondary | ICD-10-CM | POA: Diagnosis not present

## 2024-05-07 NOTE — Progress Notes (Signed)
 "  Breast and Pelvic Exam Patient name: Jody Clarke MRN [id number]  Date of birth: 1961-01-18 Chief Complaint:   Gynecologic Exam  History of Present Illness:   Jody Clarke is a 64 y.o. G34P1011 Caucasian female being seen today for breast and pelvic exam.  Denies vaginal bleeding.  She is on HRT.  Is on HRT.  Was taking estradiol  1.0mg  daily.  Also on progesterone  200mg  daily.  Has had some intermittent breast pain in the same location where her breast biopsy was performed.    Vaginal estradiol  cream has helped with feeling like she has a UTI.  Urinary pressure and urgency has helped.  We discussed pelvic PT to help with nocturia.  Not really interested in doing that at this time.    Having some more headaches.  Felt it was more sinus related.  She has seen ENT.  Has had allergy  testing.  Had CT as well that didn't show sinus issues.  Seeing Dr. Jodie tomorrow.  Discussed considering lowering dosage of HRT.  Neurology referral discussed as well.  She will discuss with Dr. Jodie tomorrow as well.   Patient's last menstrual period was 04/16/2008.  Last pap 03/28/2023. Results were: NILM w/ HRHPV negative. H/O abnormal pap: no Last mammogram: Patient had screening and diagnostic Mammogram/Ultrasound in December 2024. Biopsy done on 04/19/2023 and tissue was benign. Family h/o breast cancer: no Last colonoscopy: 01/02/2022 Cologuard. Results were: normal. Family h/o colorectal cancer: no.  Will send order for this.   DEXA:  1/25 T score -2.4.        03/06/2024    8:36 AM 05/29/2023    1:07 PM 03/28/2023    3:17 PM 03/06/2023    1:18 PM 12/07/2021    1:29 PM  Depression screen PHQ 2/9  Decreased Interest 0 0 0 0 0  Down, Depressed, Hopeless 0 0 0 0 0  PHQ - 2 Score 0 0 0 0 0        03/06/2024    8:36 AM 03/06/2023    1:18 PM  GAD 7 : Generalized Anxiety Score  Nervous, Anxious, on Edge 2  0   Control/stop worrying 1  0   Worry too much - different things 1  0   Trouble  relaxing 2  0   Restless 1  0   Easily annoyed or irritable 0  0   Afraid - awful might happen 0  0   Total GAD 7 Score 7 0  Anxiety Difficulty  Not difficult at all     Data saved with a previous flowsheet row definition     Review of Systems:   Pertinent items are noted in HPI Denies any bowel changes.  Urinary changes as per above.   Pertinent History Reviewed:  Reviewed past medical,surgical, social and family history.  Reviewed problem list, medications and allergies. Physical Assessment:   Vitals:   05/07/24 1115  BP: 103/71  Pulse: 88  SpO2: 100%  Weight: 107 lb 12.8 oz (48.9 kg)  Height: 5' 3.5 (1.613 m)  Body mass index is 18.8 kg/m.        Physical Examination:   General appearance - well appearing, and in no distress  Mental status - alert, oriented to person, place, and time  Psych:  She has a normal mood and affect  Skin - warm and dry, normal color, no suspicious lesions noted  Chest - effort normal, all lung fields clear to auscultation bilaterally  Heart -  normal rate and regular rhythm  Neck:  midline trachea, no thyromegaly or nodules  Breasts - breasts appear normal, no suspicious masses, no skin or nipple changes or  axillary nodes  Abdomen - soft, nontender, nondistended, no masses or organomegaly  Pelvic - VULVA: normal appearing vulva with no masses, tenderness or lesions   VAGINA: normal appearing vagina with normal color and discharge, no lesions   CERVIX: normal appearing cervix without discharge or lesions, no CMT  Thin prep pap is not indicated  UTERUS: uterus is felt to be normal size, shape, consistency and nontender   ADNEXA: No adnexal masses or tenderness noted.  Rectal - normal rectal, good sphincter tone, no masses felt  Extremities:  No swelling or varicosities noted  Chaperone present for exam  No results found for this or any previous visit (from the past 24 hours).  Assessment & Plan:  1. Encntr for gyn exam (general)  (routine) w/o abn findings (Primary) - Pap smear 03/2023.  Not indicated today. - Mammogram 03/2023.  Aware needs to schedule. - Colonoscopy declined but had cologuard 12/2021.  Discussed due later this year.  - Bone mineral density 1/92024 - lab work done with PCP, Dr. Jodie - vaccines reviewed/updated  2. Postmenopausal HRT (hormone replacement therapy) - on compounded biest and progesterone  SR through [facility].  Discussed considering lowering HRT dosage due to headaches she is having.  Also recommended considering neurology referral.  She is seeing Dr. Jodie tomorrow so will discuss as well.  If needs referral, advised to let me know.  3. Osteopenia of both hips - reviewed DEXA just done 04/2023  4. Vitamin D  deficiency -on oral Vit D 1000IU daily  5. Meniere's disease of left ear     No orders of the defined types were placed in this encounter.   Meds: No orders of the defined types were placed in this encounter.   Follow-up: No follow-ups on file.  Ronal GORMAN Pinal, MD 05/07/2024 11:50 AM "

## 2024-05-08 ENCOUNTER — Encounter: Payer: Self-pay | Admitting: Family Medicine

## 2024-05-08 ENCOUNTER — Other Ambulatory Visit (HOSPITAL_BASED_OUTPATIENT_CLINIC_OR_DEPARTMENT_OTHER): Payer: Self-pay | Admitting: Obstetrics & Gynecology

## 2024-05-08 ENCOUNTER — Ambulatory Visit (INDEPENDENT_AMBULATORY_CARE_PROVIDER_SITE_OTHER): Admitting: Family Medicine

## 2024-05-08 VITALS — BP 110/68 | HR 84 | Temp 97.7°F | Ht 63.5 in | Wt 109.8 lb

## 2024-05-08 DIAGNOSIS — Z79890 Hormone replacement therapy (postmenopausal): Secondary | ICD-10-CM

## 2024-05-08 DIAGNOSIS — N952 Postmenopausal atrophic vaginitis: Secondary | ICD-10-CM

## 2024-05-08 DIAGNOSIS — H8103 Meniere's disease, bilateral: Secondary | ICD-10-CM

## 2024-05-08 DIAGNOSIS — R519 Headache, unspecified: Secondary | ICD-10-CM

## 2024-05-08 MED ORDER — SUMATRIPTAN SUCCINATE 50 MG PO TABS: 50.0000 mg | ORAL_TABLET | Freq: Once | ORAL | 0 refills | Status: AC

## 2024-05-08 NOTE — Patient Instructions (Signed)
 Please follow up if symptoms do not improve or as needed.     VISIT SUMMARY: During your visit, we discussed your persistent headaches and sinus-related symptoms that have been ongoing since September. You also mentioned experiencing jaw tingling, dizziness, and a sensation of fullness in your head during headache episodes. We reviewed your history of Meniere's disease and recent treatments, including antibiotics and prednisone , which provided temporary relief. Your CT scan results were normal, and we considered the possibility of tension headaches or migraines.  YOUR PLAN: -CHRONIC HEADACHE SYNDROME: Chronic headache syndrome refers to frequent and persistent headaches. Your headaches have been ongoing since September, with a severe episode on New Year's Eve. We discussed that stress and tension might be contributing factors. I have prescribed Imitrex for you to take at the onset of a headache, and you can repeat the dose in two hours if needed. Additionally, I am referring you to a headache specialist for further evaluation and management. You should continue your current hormonal therapy regimen.  INSTRUCTIONS: Please follow up with the headache specialist as referred for further evaluation and management of your headaches. Continue taking Imitrex as prescribed for acute headache relief and maintain your current hormonal therapy regimen.    Contains text generated by [employer].

## 2024-05-08 NOTE — Progress Notes (Signed)
 "  Subjective  CC:  Chief Complaint  Patient presents with   Migraine    Follow up appt; pt was told to follow up with pcp per ENT and GYN for migraines; will complete mammogram in feb 2026; will come back for tdap shot when she feels better;     HPI: Jody Clarke is a 64 y.o. female who presents to the office today to address the problems listed above in the chief complaint. Discussed the use of AI scribe software for clinical note transcription with the patient, who gave verbal consent to proceed.  History of Present Illness Jody Clarke is a 64 year old female with Meniere's disease who presents with persistent headaches and sinus-related symptoms. I reviewed multiple notes from UC and Dr. Carlie, ENT.  Cephalgia and associated neurological symptoms - Persistent headaches since September, primarily located over the eyes - Episodes of jaw tingling and dizziness accompanying headaches - Sensation of fullness in the head during headache episodes - Severe headache episode on New Year's Eve with jaw tingling, dizziness, and shaking sensation, lasting approximately five days - No nausea, photophobia, or phonophobia - Advil used intermittently with inconsistent relief - Half dose of ibuprofen taken during severe episode, with partial relief - no h/o migraines. - sometimes, headache is dull and wraps around head to occiput. Some tightness - no tearing or neurologic sxs. - has h/o meniere's but ENT is doubtful it is related to headaches - had clear CT sinuses recently.  - has h/o brain MRI for workup of neuroma.  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 Frequent headaches   2. Active cochleovestibular Meniere's disease of both ears   3. Postmenopausal HRT (hormone replacement therapy)      Plan  Assessment and Plan Assessment & Plan Chronic headache syndrome Intermittent headaches over the eyes and sometimes behind, persisting since September. Recent severe episode on New Year's Eve with associated jaw tingling and dizziness. CT scan normal. Differential includes tension headaches and migraines. No classic migraine symptoms like unilateral pain, photophobia, or nausea. Stress and tension may contribute. Hormonal therapy unlikely to be the cause. Consideration of Meniere's disease correlation with sinus-type headaches. - Prescribed Imitrex for acute headache management and diagnostic testing. Instructed to take at onset of headache and repeat in two hours if needed. - Referred to headache specialist for further evaluation and management. - Continue current hormonal therapy regimen.  I personally spent a total of 32 minutes in the care of the patient today including preparing to see the patient, getting/reviewing separately obtained history, performing a medically appropriate exam/evaluation, counseling and educating, placing orders, documenting clinical information in the  EHR, independently  interpreting results, and communicating results.   Follow up: prn Orders Placed This Encounter  Procedures   AMB referral to headache clinic   Meds ordered this encounter  Medications   SUMAtriptan (IMITREX) 50 MG tablet    Sig: Take 1 tablet (50 mg total) by mouth once. At onset of headache. May repeat in 2 hours if headache persists or recurs.    Dispense:  10 tablet    Refill:  0     I reviewed the patients updated PMH, FH, and SocHx.  Patient Active Problem List   Diagnosis Date Noted   Cochleovestibular active Meniere's disease 05/03/2015    Priority: High   Insomnia 05/03/2015    Priority: High   Osteoporosis 07/28/2020    Priority: Medium    Recurrent aphthous ulcer 07/03/2019    Priority: Medium    Postmenopausal HRT (hormone replacement therapy) 09/10/2018    Priority: Medium    Adjustment disorder with anxiety 05/03/2015    Priority: Medium    Asymmetrical left sensorineural hearing loss 05/03/2015    Priority: Medium    Chronic allergic rhinitis 09/10/2018    Priority: Low   Vitamin D  deficiency 05/03/2015    Priority: Low   Deviated nasal septum 05/03/2015    Priority: Low   Active Medications[1] Allergies: Patient is allergic to sulfa antibiotics and ciprofloxacin . Family History: Patient family history includes Atrial fibrillation in her father; Cancer in her father; Osteoporosis in her mother. Social History:  Patient  reports that she has never smoked. She has never been exposed to tobacco smoke. She has never used smokeless tobacco. She reports that she does not currently use alcohol. She reports that she does not use drugs.  Review of Systems: Constitutional: Negative for fever malaise or anorexia Cardiovascular: negative for chest pain Respiratory: negative for SOB or persistent cough Gastrointestinal: negative for abdominal pain  Objective  Vitals: BP 110/68 (BP Location: Left Arm, Patient Position: Sitting, Cuff Size: Normal)   Pulse 84    Temp 97.7 F (36.5 C) (Temporal)   Ht 5' 3.5 (1.613 m)   Wt 109 lb 12.8 oz (49.8 kg)   LMP 04/16/2008   SpO2 99%   BMI 19.15 kg/m  General: no acute distress , A&Ox3 HEENT: PEERL, conjunctiva normal, neck is supple Non focal neuro Commons side effects, risks, benefits, and alternatives for medications and treatment plan prescribed today were discussed, and the patient expressed understanding of the given instructions. Patient is instructed to call or message via MyChart if he/she has any questions or concerns regarding our treatment plan. No barriers to understanding were identified. We discussed Red Flag symptoms and signs in detail. Patient expressed understanding regarding what to do in case of urgent or emergency type symptoms.  Medication list was reconciled, printed and provided to the patient in AVS. Patient instructions and summary information was reviewed with the patient as documented in the AVS. This note was prepared with assistance of Dragon voice recognition software. Occasional wrong-word or sound-a-like substitutions may have occurred due to the inherent limitations of voice recognition software    [1]  Current Meds  Medication Sig   ALPRAZolam  (XANAX ) 0.5 MG tablet Take 1 tablet (0.5 mg total) by mouth at bedtime as needed for anxiety (vertigo).   azelastine (ASTELIN) 0.1 % nasal spray daily as needed.   b complex vitamins capsule Take 1 capsule by mouth daily.   cetirizine  (ZYRTEC ) 10 MG tablet Take 10 mg by mouth daily.   Cholecalciferol (  VITAMIN D3) 25 MCG (1000 UT) CAPS Take by mouth.   estradiol  (ESTRACE ) 0.1 MG/GM vaginal cream 1 gram vaginally twice weekly   Fluticasone  Propionate (XHANCE ) 93 MCG/ACT EXHU 1-2 sprays per nostril twice a day.   NONFORMULARY OR COMPOUNDED ITEM Apply 0.5 mLs topically every morning. biest 50/50.  0.58ml (two clicks) topically qam   NONFORMULARY OR COMPOUNDED ITEM Take 1 capsule by mouth daily. Progesterone  SR 200mg    SUMAtriptan  (IMITREX) 50 MG tablet Take 1 tablet (50 mg total) by mouth once. At onset of headache. May repeat in 2 hours if headache persists or recurs.   tretinoin (RETIN-A) 0.05 % cream Apply 1 application  topically daily.   triamterene-hydrochlorothiazide (DYAZIDE) 37.5-25 MG capsule Take 1 capsule by mouth daily.   "

## 2024-05-28 ENCOUNTER — Ambulatory Visit (HOSPITAL_BASED_OUTPATIENT_CLINIC_OR_DEPARTMENT_OTHER): Payer: Managed Care, Other (non HMO) | Admitting: Obstetrics & Gynecology

## 2025-03-09 ENCOUNTER — Encounter: Admitting: Family Medicine
